# Patient Record
Sex: Female | Born: 2000
Health system: Southern US, Community
[De-identification: ages and names within clinical notes are randomized; demographics above are authoritative.]

## PROBLEM LIST (undated history)

## (undated) DIAGNOSIS — H6690 Otitis media, unspecified, unspecified ear: Secondary | ICD-10-CM

## (undated) DIAGNOSIS — L709 Acne, unspecified: Secondary | ICD-10-CM

## (undated) DIAGNOSIS — R630 Anorexia: Secondary | ICD-10-CM

## (undated) DIAGNOSIS — R55 Syncope and collapse: Secondary | ICD-10-CM

## (undated) DIAGNOSIS — H3500 Unspecified background retinopathy: Secondary | ICD-10-CM

## (undated) HISTORY — PX: WISDOM TOOTH EXTRACTION: SHX21

## (undated) HISTORY — DX: Acne, unspecified: L70.9

## (undated) HISTORY — PX: TYMPANOSTOMY TUBE PLACEMENT: SHX32

## (undated) HISTORY — PX: ADENOIDECTOMY: SUR15

## (undated) HISTORY — DX: Otitis media, unspecified, unspecified ear: H66.90

## (undated) HISTORY — PX: EYE SURGERY: SHX253

## (undated) HISTORY — DX: Unspecified background retinopathy: H35.00

---
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
## 2020-01-17 ENCOUNTER — Ambulatory Visit: Payer: BC Managed Care – PPO | Attending: Internal Medicine

## 2020-01-17 ENCOUNTER — Other Ambulatory Visit: Payer: Self-pay

## 2020-01-17 DIAGNOSIS — Z23 Encounter for immunization: Secondary | ICD-10-CM

## 2020-01-17 NOTE — Progress Notes (Signed)
   Covid-19 Vaccination Clinic  Name:  Lacoya Dobosz    MRN: [id number] DOB: 2000/12/25  01/17/2020  Ms. Branum was observed post Covid-19 immunization for 15 minutes without incident. She was provided with Vaccine Information Sheet and instruction to access the V-Safe system.   Ms. Haines was instructed to call 911 with any severe reactions post vaccine: Marland Kitchen Difficulty breathing  . Swelling of face and throat  . A fast heartbeat  . A bad rash all over body  . Dizziness and weakness   Immunizations Administered    Name Date Dose VIS Date Route   Pfizer COVID-19 Vaccine 01/17/2020 11:26 AM 0.3 mL 10/18/2019 Intramuscular   Manufacturer: [place]   Lot: [id number]   [place]: [id number]

## 2020-02-12 ENCOUNTER — Other Ambulatory Visit: Payer: Self-pay

## 2020-02-12 ENCOUNTER — Ambulatory Visit: Payer: BC Managed Care – PPO | Attending: Internal Medicine

## 2020-02-12 ENCOUNTER — Ambulatory Visit: Payer: BC Managed Care – PPO | Admitting: Dermatology

## 2020-02-12 DIAGNOSIS — L905 Scar conditions and fibrosis of skin: Secondary | ICD-10-CM | POA: Diagnosis not present

## 2020-02-12 DIAGNOSIS — L700 Acne vulgaris: Secondary | ICD-10-CM | POA: Diagnosis not present

## 2020-02-12 DIAGNOSIS — Z1283 Encounter for screening for malignant neoplasm of skin: Secondary | ICD-10-CM | POA: Diagnosis not present

## 2020-02-12 DIAGNOSIS — Z23 Encounter for immunization: Secondary | ICD-10-CM

## 2020-02-12 DIAGNOSIS — D229 Melanocytic nevi, unspecified: Secondary | ICD-10-CM

## 2020-02-12 DIAGNOSIS — Z808 Family history of malignant neoplasm of other organs or systems: Secondary | ICD-10-CM | POA: Diagnosis not present

## 2020-02-12 DIAGNOSIS — L578 Other skin changes due to chronic exposure to nonionizing radiation: Secondary | ICD-10-CM

## 2020-02-12 NOTE — Progress Notes (Signed)
   Covid-19 Vaccination Clinic  Name:  Breanna Cook    MRN: [id number] DOB: 01-05-2001  02/12/2020  Breanna Cook was observed post Covid-19 immunization for 15 minutes without incident. She was provided with Vaccine Information Sheet and instruction to access the V-Safe system.   Breanna Cook was instructed to call 911 with any severe reactions post vaccine: Marland Kitchen Difficulty breathing  . Swelling of face and throat  . A fast heartbeat  . A bad rash all over body  . Dizziness and weakness   Immunizations Administered    Name Date Dose VIS Date Route   Pfizer COVID-19 Vaccine 02/12/2020  2:20 PM 0.3 mL 10/18/2019 Intramuscular   Manufacturer: [place]   Lot: [id number]   [place]: [id number]

## 2020-02-12 NOTE — Patient Instructions (Addendum)
Recommend taking Heliocare sun protection supplement daily in sunny weather for additional sun protection. For maximum protection on the sunniest days, you can take up to 2 capsules of regular Heliocare OR take 1 capsule of Heliocare Ultra. For prolonged exposure (such as a full day in the sun), you can repeat your dose of the supplement 4 hours after your first dose. Heliocare can be purchased at [facility] or at [email or web address].   Recommend daily broad spectrum sunscreen SPF 30+ to sun-exposed areas, reapply every 2 hours as needed. Call for new or changing lesions.  Doxycycline should be taken with food to prevent nausea. Do not lay down for 30 minutes after taking. Be cautious with sun exposure and use good sun protection while on this medication. Pregnant women should not take this medication.   Spironolactone can cause increased urination and cause blood pressure to decrease. Please watch for signs of lightheadedness and be cautious when changing position. It can sometimes cause breast tenderness or an irregular period in premenopausal women. It can also increase potassium. The increase in potassium usually is not a concern unless you are taking other medicines that also increase potassium, so please be sure your doctor knows all of the other medications you are taking. This medication should not be taken  by pregnant women.

## 2020-02-12 NOTE — Progress Notes (Signed)
   Follow-Up Visit   Subjective  Breanna Cook is a 19 y.o. female who presents for the following: Annual Exam (FhxMM) and Acne. Patient taking spironolactone 100mg  1 po qhs, doxycycline 20mg  qd for acne. Also using clindamycin solution and CLn wash. Patient advises she is being more consistent with acne regimen and has seen some improvement.  She is still breaking out and break outs lasts a few days.   The following portions of the chart were reviewed this encounter and updated as appropriate: Tobacco  Allergies  Meds  Problems  Med Hx  Surg Hx  Fam Hx      Review of Systems: No other skin or systemic complaints.  Objective  Well appearing patient in no apparent distress; mood and affect are within normal limits.  A full examination was performed including scalp, head, eyes, ears, nose, lips, neck, chest, axillae, abdomen, back, buttocks, bilateral upper extremities, bilateral lower extremities, hands, feet, fingers, toes, fingernails, and toenails. All findings within normal limits unless otherwise noted below.  Objective  face, back: Trace open comedones at face Rare tiny inflammatory papule at back  Objective  back: Acne scars  Assessment & Plan    Skin cancer screening performed today.  Melanocytic Nevi - Tan-brown and/or pink-flesh-colored symmetric macules and papules - Benign appearing on exam today - Observation - Call clinic for new or changing moles - Recommend daily use of broad spectrum spf 30+ sunscreen to sun-exposed areas.   Actinic Damage, Chronic - diffuse scaly erythematous macules with underlying dyspigmentation - Recommend daily broad spectrum sunscreen SPF 30+ to sun-exposed areas, reapply every 2 hours as needed.  - Call for new or changing lesions.  Acne vulgaris face, back  Chronic. Well controlled currently.  Cont spironolactone 100mg  1 PO QHS. Cont doxycycline 20mg  1PO QD-BID with food.  Cont Cln wash Cont clindamycin solution QD BP  122/76  Spironolactone can cause increased urination and cause blood pressure to decrease. Please watch for signs of lightheadedness and be cautious when changing position. It can sometimes cause breast tenderness or an irregular period in premenopausal women. It can also increase potassium. The increase in potassium usually is not a concern unless you are taking other medicines that also increase potassium, so please be sure your doctor knows all of the other medications you are taking. This medication should not be taken  by pregnant women.  Doxycycline should be taken with food to prevent nausea. Do not lay down for 30 minutes after taking. Be cautious with sun exposure and use good sun protection while on this medication. Pregnant women should not take this medication.   Recommend taking Heliocare sun protection supplement daily in sunny weather for additional sun protection. For maximum protection on the sunniest days, you can take up to 2 capsules of regular Heliocare OR take 1 capsule of Heliocare Ultra. For prolonged exposure (such as a full day in the sun), you can repeat your dose of the supplement 4 hours after your first dose. Heliocare can be purchased at [facility] or at [email or web address].    Scar back  Discussed microneedling to help improve scars  Return in about 6 months (around 08/13/2020) for ACNE.   Graciella Belton, RMA, am acting as scribe for Forest Gleason, MD .   Documentation: I have reviewed the above documentation for accuracy and completeness, and I agree with the above.  Forest Gleason, MD

## 2020-03-13 ENCOUNTER — Encounter: Payer: Self-pay | Admitting: Dermatology

## 2020-04-21 DIAGNOSIS — H5203 Hypermetropia, bilateral: Secondary | ICD-10-CM | POA: Diagnosis not present

## 2020-04-21 DIAGNOSIS — H52223 Regular astigmatism, bilateral: Secondary | ICD-10-CM | POA: Diagnosis not present

## 2020-04-21 DIAGNOSIS — H5043 Accommodative component in esotropia: Secondary | ICD-10-CM | POA: Diagnosis not present

## 2020-04-22 DIAGNOSIS — F339 Major depressive disorder, recurrent, unspecified: Secondary | ICD-10-CM | POA: Diagnosis not present

## 2020-04-22 DIAGNOSIS — N938 Other specified abnormal uterine and vaginal bleeding: Secondary | ICD-10-CM | POA: Diagnosis not present

## 2020-04-22 DIAGNOSIS — F419 Anxiety disorder, unspecified: Secondary | ICD-10-CM | POA: Diagnosis not present

## 2020-04-22 DIAGNOSIS — E559 Vitamin D deficiency, unspecified: Secondary | ICD-10-CM | POA: Diagnosis not present

## 2020-05-05 ENCOUNTER — Other Ambulatory Visit: Payer: Self-pay

## 2020-05-05 MED ORDER — SPIRONOLACTONE 100 MG PO TABS: 100.0000 mg | ORAL_TABLET | Freq: Every day | ORAL | 2 refills | Status: DC

## 2020-05-05 NOTE — Progress Notes (Signed)
Fax from [place] requesting Spironolactone refill approved.

## 2020-06-08 DIAGNOSIS — H5043 Accommodative component in esotropia: Secondary | ICD-10-CM | POA: Diagnosis not present

## 2020-06-08 DIAGNOSIS — H5203 Hypermetropia, bilateral: Secondary | ICD-10-CM | POA: Diagnosis not present

## 2020-06-11 DIAGNOSIS — N939 Abnormal uterine and vaginal bleeding, unspecified: Secondary | ICD-10-CM | POA: Diagnosis not present

## 2020-06-11 DIAGNOSIS — Z975 Presence of (intrauterine) contraceptive device: Secondary | ICD-10-CM | POA: Diagnosis not present

## 2020-06-11 DIAGNOSIS — N921 Excessive and frequent menstruation with irregular cycle: Secondary | ICD-10-CM | POA: Diagnosis not present

## 2020-06-23 DIAGNOSIS — Z20822 Contact with and (suspected) exposure to covid-19: Secondary | ICD-10-CM | POA: Diagnosis not present

## 2020-07-03 DIAGNOSIS — J0100 Acute maxillary sinusitis, unspecified: Secondary | ICD-10-CM | POA: Diagnosis not present

## 2020-07-09 DIAGNOSIS — N921 Excessive and frequent menstruation with irregular cycle: Secondary | ICD-10-CM | POA: Diagnosis not present

## 2020-07-09 DIAGNOSIS — Z975 Presence of (intrauterine) contraceptive device: Secondary | ICD-10-CM | POA: Diagnosis not present

## 2020-07-09 DIAGNOSIS — N939 Abnormal uterine and vaginal bleeding, unspecified: Secondary | ICD-10-CM | POA: Diagnosis not present

## 2020-07-16 DIAGNOSIS — Z23 Encounter for immunization: Secondary | ICD-10-CM | POA: Diagnosis not present

## 2020-08-07 ENCOUNTER — Other Ambulatory Visit: Payer: Self-pay | Admitting: Dermatology

## 2020-08-10 ENCOUNTER — Other Ambulatory Visit: Payer: Self-pay | Admitting: Dermatology

## 2020-08-20 ENCOUNTER — Encounter: Payer: Self-pay | Admitting: Dermatology

## 2020-08-20 ENCOUNTER — Other Ambulatory Visit: Payer: Self-pay

## 2020-08-20 ENCOUNTER — Other Ambulatory Visit: Payer: Self-pay | Admitting: Dermatology

## 2020-08-20 ENCOUNTER — Ambulatory Visit: Payer: BC Managed Care – PPO | Admitting: Dermatology

## 2020-08-20 VITALS — BP 123/82 | HR 106

## 2020-08-20 DIAGNOSIS — L700 Acne vulgaris: Secondary | ICD-10-CM

## 2020-08-20 MED ORDER — CLINDAMYCIN PHOSPHATE 1 % EX SOLN: CUTANEOUS | 5 refills | Status: DC

## 2020-08-20 MED ORDER — DOXYCYCLINE HYCLATE 20 MG PO TABS: 20.0000 mg | ORAL_TABLET | Freq: Two times a day (BID) | ORAL | 5 refills | Status: AC

## 2020-08-20 MED ORDER — SPIRONOLACTONE 100 MG PO TABS
100.0000 mg | ORAL_TABLET | Freq: Every day | ORAL | 2 refills | Status: DC
Start: 2020-08-20 — End: 2021-03-16

## 2020-08-20 NOTE — Patient Instructions (Signed)
Recommend daily broad spectrum sunscreen SPF 30+ to sun-exposed areas, reapply every 2 hours as needed. Call for new or changing lesions.  

## 2020-08-20 NOTE — Progress Notes (Signed)
   Follow-Up Visit   Subjective  Breanna Cook is a 19 y.o. female who presents for the following: Follow-up (Acne).  Patient presents today for follow up on OV 02/12/20 for Acne. Patient is using Clindamycin solution, doxycycline 20 mg QD, Spironolactone 100mg  1po QD. She reports she is doing very well.   The following portions of the chart were reviewed this encounter and updated as appropriate:  Tobacco  Allergies  Meds  Problems  Med Hx  Surg Hx  Fam Hx      Review of Systems:  No other skin or systemic complaints except as noted in HPI or Assessment and Plan.  Objective  Well appearing patient in no apparent distress; mood and affect are within normal limits.  A focused examination was performed including face, neck, chest and back. Relevant physical exam findings are noted in the Assessment and Plan.  Objective  Face, Chest and Back: Face clear, Rare open comedone at chest Back clear   Assessment & Plan  Acne vulgaris Face, Chest and Back  Chronic, well controlled  Stop Doxycycline Continue Spironolactone 100 mg QHS Continue Clindamycin solution  Spironolactone can cause increased urination and cause blood pressure to decrease. Please watch for signs of lightheadedness and be cautious when changing position. It can sometimes cause breast tenderness or an irregular period in premenopausal women. It can also increase potassium. The increase in potassium usually is not a concern unless you are taking other medicines that also increase potassium, so please be sure your doctor knows all of the other medications you are taking. This medication should not be taken by pregnant women.  This medicine should also not be taken together with sulfa drugs like Bactrim (trimethoprim/sulfamethexazole).    Return in about 6 months (around 02/18/2021) for Acne.  IDonzetta Kohut, CMA, am acting as scribe for Forest Gleason, MD .  Documentation: I have reviewed the above documentation  for accuracy and completeness, and I agree with the above.  Forest Gleason, MD

## 2020-09-01 ENCOUNTER — Encounter: Payer: Self-pay | Admitting: Dermatology

## 2020-09-14 ENCOUNTER — Ambulatory Visit (INDEPENDENT_AMBULATORY_CARE_PROVIDER_SITE_OTHER): Payer: BC Managed Care – PPO | Admitting: Obstetrics & Gynecology

## 2020-09-14 ENCOUNTER — Other Ambulatory Visit: Payer: Self-pay

## 2020-09-14 ENCOUNTER — Encounter: Payer: Self-pay | Admitting: Obstetrics & Gynecology

## 2020-09-14 VITALS — BP 138/93 | HR 102 | Ht 60.75 in | Wt 125.0 lb

## 2020-09-14 DIAGNOSIS — Z01818 Encounter for other preprocedural examination: Secondary | ICD-10-CM

## 2020-09-14 DIAGNOSIS — Z30430 Encounter for insertion of intrauterine contraceptive device: Secondary | ICD-10-CM

## 2020-09-14 LAB — POCT URINE PREGNANCY: Preg Test, Ur: NEGATIVE

## 2020-09-14 MED ORDER — LEVONORGESTREL 19.5 MG IU IUD: INTRAUTERINE_SYSTEM | Freq: Once | INTRAUTERINE | Status: AC

## 2020-09-14 NOTE — Progress Notes (Signed)
    GYNECOLOGY OFFICE PROCEDURE NOTE  Breanna Cook is a 19 y.o. G0P0000 here for [facility] IUD insertion. No GYN concerns.  Had a lot of questions which were addressed prior to placement of the IUD. Accompanied by her boyfriend.   IUD Insertion Procedure Note Patient identified, informed consent performed, consent signed.   Discussed risks of irregular bleeding, cramping, infection, malpositioning or misplacement of the IUD outside the uterus which may require further procedure such as laparoscopy. Also discussed >99% contraception efficacy, increased risk of ectopic pregnancy with failure of method.   Emphasized that this did not protect against STIs, condoms recommended during all sexual encounters. Time out was performed.  Urine pregnancy test negative.  Speculum placed in the vagina.  Cervix visualized.  Cleaned with Betadine x 2.  Grasped anteriorly with a single tooth tenaculum.  Uterus sounded to 7 cm.  Kyleena IUD placed per manufacturer's recommendations.  Strings trimmed to 3 cm. Tenaculum was removed, good hemostasis noted.  Patient tolerated procedure well.   Patient was given post-procedure instructions.  She was advised to have backup contraception for one week.  Patient was also asked to follow up in 4 weeks for IUD check.   Verita Schneiders, MD, [place] for [employer], [place]

## 2020-09-14 NOTE — Patient Instructions (Signed)
IUD PLACEMENT POST-PROCEDURE INSTRUCTIONS  1. You may take Ibuprofen, Aleve or Tylenol for pain if needed for cramping  2. You may have a small amount of spotting.  You should wear a mini pad for the next few days.  3. You may have intercourse after 24 hours.  If you using this for birth control, it is effective immediately.  4. You need to call if you have any pelvic pain, fever, heavy bleeding or foul smelling vaginal discharge.  Irregular bleeding is common the first several months after having an IUD placed. You do not need to call for this reason unless you are concerned.  5. Shower or bathe as normal  6. You should have a follow-up appointment in 4 weeks for a re-check to make sure you are not having any problems.  7. Please take your birth control pills for the next month, this will help with the irregular bleeding

## 2020-09-29 DIAGNOSIS — N921 Excessive and frequent menstruation with irregular cycle: Secondary | ICD-10-CM | POA: Diagnosis not present

## 2020-09-29 DIAGNOSIS — Z113 Encounter for screening for infections with a predominantly sexual mode of transmission: Secondary | ICD-10-CM | POA: Diagnosis not present

## 2020-09-29 DIAGNOSIS — Z975 Presence of (intrauterine) contraceptive device: Secondary | ICD-10-CM | POA: Diagnosis not present

## 2020-09-29 DIAGNOSIS — T8332XA Displacement of intrauterine contraceptive device, initial encounter: Secondary | ICD-10-CM | POA: Diagnosis not present

## 2020-09-29 DIAGNOSIS — E559 Vitamin D deficiency, unspecified: Secondary | ICD-10-CM | POA: Diagnosis not present

## 2020-09-29 DIAGNOSIS — Z23 Encounter for immunization: Secondary | ICD-10-CM | POA: Diagnosis not present

## 2020-09-29 DIAGNOSIS — N939 Abnormal uterine and vaginal bleeding, unspecified: Secondary | ICD-10-CM | POA: Diagnosis not present

## 2020-09-29 DIAGNOSIS — Z0000 Encounter for general adult medical examination without abnormal findings: Secondary | ICD-10-CM | POA: Diagnosis not present

## 2020-10-02 DIAGNOSIS — T8332XA Displacement of intrauterine contraceptive device, initial encounter: Secondary | ICD-10-CM | POA: Diagnosis not present

## 2020-10-02 DIAGNOSIS — Z975 Presence of (intrauterine) contraceptive device: Secondary | ICD-10-CM | POA: Diagnosis not present

## 2020-10-02 DIAGNOSIS — N938 Other specified abnormal uterine and vaginal bleeding: Secondary | ICD-10-CM | POA: Diagnosis not present

## 2020-10-02 DIAGNOSIS — N939 Abnormal uterine and vaginal bleeding, unspecified: Secondary | ICD-10-CM | POA: Diagnosis not present

## 2020-10-02 DIAGNOSIS — N921 Excessive and frequent menstruation with irregular cycle: Secondary | ICD-10-CM | POA: Diagnosis not present

## 2020-10-12 ENCOUNTER — Other Ambulatory Visit: Payer: Self-pay

## 2020-10-12 ENCOUNTER — Ambulatory Visit (INDEPENDENT_AMBULATORY_CARE_PROVIDER_SITE_OTHER): Payer: BC Managed Care – PPO | Admitting: Obstetrics & Gynecology

## 2020-10-12 ENCOUNTER — Encounter: Payer: Self-pay | Admitting: Obstetrics & Gynecology

## 2020-10-12 VITALS — BP 124/79 | HR 89 | Wt 126.4 lb

## 2020-10-12 DIAGNOSIS — Z30431 Encounter for routine checking of intrauterine contraceptive device: Secondary | ICD-10-CM

## 2020-10-12 NOTE — Progress Notes (Signed)
    GYNECOLOGY OFFICE ENCOUNTER NOTE  History:  19 y.o. G0P0000 here today for today for IUDstring check; Kyleena  IUD was placed 09/14/2020. No complaints about the IUD, no concerning side effects.  The following portions of the patient's history were reviewed and updated as appropriate: allergies, current medications, past family history, past medical history, past social history, past surgical history and problem list.   Review of Systems:  Pertinent items are noted in HPI.   Objective:  Physical Exam Blood pressure 124/79, pulse 89, weight 126 lb 6.4 oz (57.3 kg), last menstrual period 10/05/2020. CONSTITUTIONAL: Well-developed, well-nourished female in no acute distress.  HENT:  Normocephalic, atraumatic. External right and left ear normal. Oropharynx is clear and moist EYES: Conjunctivae and EOM are normal. Pupils are equal, round, and reactive to light. No scleral icterus.  NECK: Normal range of motion, supple, no masses CARDIOVASCULAR: Normal heart rate noted RESPIRATORY: Effort and breath sounds normal, no problems with respiration noted ABDOMEN: Soft, no distention noted.   PELVIC: Normal appearing external genitalia; normal appearing vaginal mucosa and cervix.  Kyleena bright blue IUD strings visualized, about 2 cm in length outside cervix.   Assessment & Plan:  Patient to keep IUD in place for up to five years; can come in for removal if she desires pregnancy earlier or for any concerning side effects.  I spent 10 minutes dedicated to the care of this patient including pre-visit review of records, face to face time with the patient discussing her conditions and treatments and post visit ordering of testing.  Verita Schneiders, MD, [place] for [employer], [place]

## 2020-10-12 NOTE — Patient Instructions (Signed)
Return to clinic for any scheduled appointments or for any gynecologic concerns as needed.   

## 2020-10-28 DIAGNOSIS — N6452 Nipple discharge: Secondary | ICD-10-CM | POA: Diagnosis not present

## 2020-10-28 DIAGNOSIS — F419 Anxiety disorder, unspecified: Secondary | ICD-10-CM | POA: Diagnosis not present

## 2020-10-28 DIAGNOSIS — Z0000 Encounter for general adult medical examination without abnormal findings: Secondary | ICD-10-CM | POA: Diagnosis not present

## 2020-10-28 DIAGNOSIS — F32A Depression, unspecified: Secondary | ICD-10-CM | POA: Diagnosis not present

## 2020-10-29 DIAGNOSIS — Z1389 Encounter for screening for other disorder: Secondary | ICD-10-CM | POA: Diagnosis not present

## 2020-10-29 DIAGNOSIS — E559 Vitamin D deficiency, unspecified: Secondary | ICD-10-CM | POA: Diagnosis not present

## 2020-10-29 DIAGNOSIS — Z0000 Encounter for general adult medical examination without abnormal findings: Secondary | ICD-10-CM | POA: Diagnosis not present

## 2020-10-29 DIAGNOSIS — Z1329 Encounter for screening for other suspected endocrine disorder: Secondary | ICD-10-CM | POA: Diagnosis not present

## 2020-10-29 DIAGNOSIS — N6452 Nipple discharge: Secondary | ICD-10-CM | POA: Diagnosis not present

## 2020-10-29 DIAGNOSIS — E538 Deficiency of other specified B group vitamins: Secondary | ICD-10-CM | POA: Diagnosis not present

## 2020-11-09 DIAGNOSIS — N643 Galactorrhea not associated with childbirth: Secondary | ICD-10-CM | POA: Diagnosis not present

## 2020-11-09 DIAGNOSIS — N938 Other specified abnormal uterine and vaginal bleeding: Secondary | ICD-10-CM | POA: Diagnosis not present

## 2020-11-12 DIAGNOSIS — E538 Deficiency of other specified B group vitamins: Secondary | ICD-10-CM | POA: Diagnosis not present

## 2020-11-12 DIAGNOSIS — N643 Galactorrhea not associated with childbirth: Secondary | ICD-10-CM | POA: Diagnosis not present

## 2020-11-12 DIAGNOSIS — R319 Hematuria, unspecified: Secondary | ICD-10-CM | POA: Diagnosis not present

## 2020-11-16 ENCOUNTER — Encounter: Payer: Self-pay | Admitting: Internal Medicine

## 2020-11-19 NOTE — Addendum Note (Signed)
Addended by: Orland Mustard on: 11/19/2020 04:40 PM   Modules accepted: Orders

## 2020-11-20 ENCOUNTER — Other Ambulatory Visit: Payer: Self-pay | Admitting: General Surgery

## 2020-11-20 DIAGNOSIS — L0591 Pilonidal cyst without abscess: Secondary | ICD-10-CM | POA: Diagnosis not present

## 2020-11-20 NOTE — Addendum Note (Signed)
Addended by: Orland Mustard on: 11/20/2020 08:14 AM   Modules accepted: Orders

## 2020-11-20 NOTE — Addendum Note (Signed)
Addended by: Thressa Sheller on: 11/20/2020 07:52 AM   Modules accepted: Orders

## 2020-11-25 DIAGNOSIS — L0591 Pilonidal cyst without abscess: Secondary | ICD-10-CM | POA: Diagnosis not present

## 2021-02-20 ENCOUNTER — Ambulatory Visit
Admission: EM | Admit: 2021-02-20 | Discharge: 2021-02-20 | Disposition: A | Discharge status: Home / Self Care | Payer: BC Managed Care – PPO

## 2021-02-20 DIAGNOSIS — Z79899 Other long term (current) drug therapy: Secondary | ICD-10-CM | POA: Diagnosis not present

## 2021-02-20 DIAGNOSIS — Z30432 Encounter for removal of intrauterine contraceptive device: Secondary | ICD-10-CM | POA: Diagnosis not present

## 2021-02-20 DIAGNOSIS — R102 Pelvic and perineal pain: Secondary | ICD-10-CM | POA: Diagnosis not present

## 2021-02-25 ENCOUNTER — Ambulatory Visit: Payer: BC Managed Care – PPO | Admitting: Dermatology

## 2021-02-25 ENCOUNTER — Other Ambulatory Visit: Payer: Self-pay

## 2021-02-25 DIAGNOSIS — L700 Acne vulgaris: Secondary | ICD-10-CM | POA: Diagnosis not present

## 2021-02-25 MED ORDER — CLINDAMYCIN PHOSPHATE 1 % EX SOLN
Freq: Every day | CUTANEOUS | 11 refills | Status: DC
Start: 2021-02-25 — End: 2021-03-16
  Filled 2021-02-25: qty 60, 30d supply, fill #0

## 2021-02-25 MED ORDER — SPIRONOLACTONE 100 MG PO TABS
100.0000 mg | ORAL_TABLET | Freq: Two times a day (BID) | ORAL | 11 refills | Status: DC
Start: 2021-02-25 — End: 2021-03-16
  Filled 2021-02-25: qty 60, 30d supply, fill #0

## 2021-02-25 MED ORDER — DOXYCYCLINE HYCLATE 20 MG PO TABS
20.0000 mg | ORAL_TABLET | Freq: Two times a day (BID) | ORAL | 11 refills | Status: AC
  Filled 2021-02-25: qty 60, 30d supply, fill #0

## 2021-02-25 NOTE — Patient Instructions (Addendum)
Continue clindamycin solution daily. Continue Cln acne wash. Continue spironolactone 100mg  1 by mouth at bedtime. May restart doxycycline 20mg  1-2 times daily with food as needed for flares.   Spironolactone can cause increased urination and cause blood pressure to decrease. Please watch for signs of lightheadedness and be cautious when changing position. It can sometimes cause breast tenderness or an irregular period in premenopausal women. It can also increase potassium. The increase in potassium usually is not a concern unless you are taking other medicines that also increase potassium, so please be sure your doctor knows all of the other medications you are taking. This medication should not be taken by pregnant women.  This medicine should also not be taken together with sulfa drugs like Bactrim (trimethoprim/sulfamethexazole).   Doxycycline should be taken with food to prevent nausea. Do not lay down for 30 minutes after taking. Be cautious with sun exposure and use good sun protection while on this medication. Pregnant women should not take this medication.   Recommend taking Heliocare sun protection supplement daily in sunny weather for additional sun protection. For maximum protection on the sunniest days, you can take up to 2 capsules of regular Heliocare OR take 1 capsule of Heliocare Ultra. For prolonged exposure (such as a full day in the sun), you can repeat your dose of the supplement 4 hours after your first dose. Heliocare can be purchased at [facility] or at [email or web address].   Recommend daily broad spectrum sunscreen SPF 30+ to sun-exposed areas, reapply every 2 hours as needed. Call for new or changing lesions.  Staying in the shade or wearing long sleeves, sun glasses (UVA+UVB protection) and wide brim hats (4-inch brim around the entire circumference of the hat) are also recommended for sun protection.

## 2021-02-25 NOTE — Progress Notes (Signed)
   Follow-Up Visit   Subjective  Breanna Cook is a 20 y.o. female who presents for the following: Follow-up (Patient here today for 6 month acne follow up. She is taking spironolactone 100mg  QHS, using clindamycin solution daily and restarted doxycycline 20mg  once daily about 2 weeks ago because of acne flare. ).  Patient advises acne has been worse but is not clear. She has changed birth control methods recently.   The following portions of the chart were reviewed this encounter and updated as appropriate:   Tobacco  Allergies  Meds  Problems  Med Hx  Surg Hx  Fam Hx      Review of Systems:  No other skin or systemic complaints except as noted in HPI or Assessment and Plan.  Objective  Well appearing patient in no apparent distress; mood and affect are within normal limits.  A focused examination was performed including face. Relevant physical exam findings are noted in the Assessment and Plan.  Objective  Face: Trace open comedones, rare small resolving inflammatory papule at face Back with rare resolving inflammatory papule, few small scars   Assessment & Plan  Acne vulgaris Face  Chronic condition with duration or expected duration over one year. Condition is bothersome to patient. Not currently at goal.  BP 122/76  Continue clindamycin solution QAM Continue Cln acne wash daily. Continue spironolactone 100mg  1 PO QHS Restart doxycycline 20 mg twice a day Discussed adding tretinoin 0.025% cream QHS as tolerated. Pt defers at this time.   Doxycycline should be taken with food to prevent nausea. Do not lay down for 30 minutes after taking. Be cautious with sun exposure and use good sun protection while on this medication. Pregnant women should not take this medication.   Spironolactone can cause increased urination and cause blood pressure to decrease. Please watch for signs of lightheadedness and be cautious when changing position. It can sometimes cause breast  tenderness or an irregular period in premenopausal women. It can also increase potassium. The increase in potassium usually is not a concern unless you are taking other medicines that also increase potassium, so please be sure your doctor knows all of the other medications you are taking. This medication should not be taken by pregnant women.  This medicine should also not be taken together with sulfa drugs like Bactrim (trimethoprim/sulfamethexazole).   Topical retinoid medications like tretinoin/Retin-A, adapalene/Differin, tazarotene/Fabior, and Epiduo/Epiduo Forte can cause dryness and irritation when first started. Only apply a pea-sized amount to the entire affected area. Avoid applying it around the eyes, edges of mouth and creases at the nose. If you experience irritation, use a good moisturizer first and/or apply the medicine less often. If you are doing well with the medicine, you can increase how often you use it until you are applying every night. Be careful with sun protection while using this medication as it can make you sensitive to the sun. This medicine should not be used by pregnant women.    Ordered Medications: spironolactone (ALDACTONE) 100 MG tablet doxycycline (PERIOSTAT) 20 MG tablet clindamycin (CLEOCIN T) 1 % external solution  Return today (on 02/25/2021) for Acne.  Graciella Belton, RMA, am acting as scribe for Forest Gleason, MD .  Documentation: I have reviewed the above documentation for accuracy and completeness, and I agree with the above.  Forest Gleason, MD

## 2021-03-01 ENCOUNTER — Encounter: Payer: Self-pay | Admitting: Dermatology

## 2021-03-04 ENCOUNTER — Other Ambulatory Visit: Payer: Self-pay | Admitting: Dermatology

## 2021-03-05 ENCOUNTER — Other Ambulatory Visit: Payer: Self-pay

## 2021-03-05 NOTE — Telephone Encounter (Signed)
Left message on voicemail to return my call. The prescription for Spironolactone that was sent in on 02/25/21 was incorrect. It should've been sent in for QD #30 [id number]. Did patient fill this already?

## 2021-03-11 ENCOUNTER — Other Ambulatory Visit: Payer: Self-pay

## 2021-03-13 ENCOUNTER — Other Ambulatory Visit: Payer: Self-pay | Admitting: Dermatology

## 2021-03-15 ENCOUNTER — Other Ambulatory Visit: Payer: Self-pay

## 2021-03-16 ENCOUNTER — Ambulatory Visit (INDEPENDENT_AMBULATORY_CARE_PROVIDER_SITE_OTHER): Payer: BC Managed Care – PPO | Admitting: Obstetrics & Gynecology

## 2021-03-16 ENCOUNTER — Other Ambulatory Visit: Payer: Self-pay

## 2021-03-16 ENCOUNTER — Encounter: Payer: Self-pay | Admitting: Obstetrics & Gynecology

## 2021-03-16 VITALS — BP 132/86 | HR 96 | Wt 130.4 lb

## 2021-03-16 DIAGNOSIS — T839XXD Unspecified complication of genitourinary prosthetic device, implant and graft, subsequent encounter: Secondary | ICD-10-CM | POA: Diagnosis not present

## 2021-03-16 DIAGNOSIS — Z3009 Encounter for other general counseling and advice on contraception: Secondary | ICD-10-CM | POA: Diagnosis not present

## 2021-03-16 NOTE — Progress Notes (Signed)
GYNECOLOGY OFFICE VISIT NOTE  History:   Breanna Cook is a 20 y.o. G0P0000 here today with report that her Mirena IUD fell out on 02/20/21 when removing menstrual cup. This was placed in 2021.  She was doing well with it but reported episodes of cramping.  Wants to discuss what to do next.  Only sexually active with her boyfriend.  She has been on OCPs prescribed by outside provider since 02/2021. She denies any abnormal vaginal discharge, bleeding, pelvic pain or other concerns.    Past Medical History:  Diagnosis Date  . Acne   . Anorexia   . AOM (acute otitis media)    recurrent (Dr Janace Hoard)  . Preterm infant    31.5 weeks  . 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 Syncope   . Vasovagal syncope     Past Surgical History:  Procedure Laterality Date  . ADENOIDECTOMY     still with tonsils 2003   . EYE SURGERY     r eye in 2010   . TYMPANOSTOMY TUBE PLACEMENT     X 3  . WISDOM TOOTH EXTRACTION     2019    The following portions of the patient's history were reviewed and updated as appropriate: allergies, current medications, past family history, past medical history, past social history, past surgical history and problem list.   Health Maintenance:  Has received complete HPV vaccine series.  Review of Systems:  Pertinent items noted in HPI and remainder of comprehensive ROS otherwise negative.  Physical Exam:  BP 132/86   Pulse 96   Wt 130 lb 6.4 oz (59.1 kg)   BMI 24.84 kg/m  CONSTITUTIONAL: Well-developed, well-nourished female in no acute distress.  HEENT:  Normocephalic, atraumatic. External right and left ear normal. No scleral icterus.  NECK: Normal range of motion, supple, no masses noted on observation SKIN: No rash noted. Not diaphoretic. No erythema. No pallor. MUSCULOSKELETAL: Normal range of motion. No edema noted. NEUROLOGIC: Alert and oriented to person, place, and time. Normal muscle tone coordination. No cranial nerve  deficit noted. PSYCHIATRIC: Normal mood and affect. Normal behavior. Normal judgment and thought content. CARDIOVASCULAR: Normal heart rate noted RESPIRATORY: Effort and breath sounds normal, no problems with respiration noted ABDOMEN: No masses noted. No other overt distention noted.   PELVIC: Deferred     Assessment and Plan:      1. IUD expulsion 2. General counseling and advice on female contraception Offered replacement of Mirena, she declined for now.  Reviewed all forms of reversible birth control options available including abstinence; fertility period awareness methods; over the counter/barrier methods; hormonal contraceptive medication including pill, patch, ring, injection,contraceptive implant; hormonal and nonhormonal IUDs. Risks and benefits reviewed.  Questions were answered.  Information was given to patient to review.  She is leaning towards Nexplanon, she will research this and let us know if she wants this inserted. In the meantime, she will continue prescribed OCPs. She was told to let us know if she needs refills. Condoms recommended for STI prevention (of note, she was offered STI screening today, she declined).  Routine preventative health maintenance measures emphasized. Please refer to After Visit Summary for other counseling recommendations.   Return for Nexplanon insertion when patient desires.    I spent 15 minutes dedicated to the care of this patient including pre-visit review of records, face to face time with the patient discussing her conditions and contraceptive options.    Jacqueline Delapena  Harolyn Rutherford, MD, [place], [facility] for [employer], [place]

## 2021-03-16 NOTE — Patient Instructions (Signed)
Etonogestrel implant What is this medicine? ETONOGESTREL (et oh noe JES trel) is a contraceptive (birth control) device. It is used to prevent pregnancy. It can be used for up to 3 years. This medicine may be used for other purposes; ask your health care provider or pharmacist if you have questions. COMMON BRAND NAME(S): Implanon, Nexplanon What should I tell my health care provider before I take this medicine? They need to know if you have any of these conditions:  abnormal vaginal bleeding  blood vessel disease or blood clots  breast, cervical, endometrial, ovarian, liver, or uterine cancer  diabetes  gallbladder disease  heart disease or recent heart attack  high blood pressure  high cholesterol or triglycerides  kidney disease  liver disease  migraine headaches  seizures  stroke  tobacco smoker  an unusual or allergic reaction to etonogestrel, anesthetics or antiseptics, other medicines, foods, dyes, or preservatives  pregnant or trying to get pregnant  breast-feeding How should I use this medicine? This device is inserted just under the skin on the inner side of your upper arm by a health care professional. Talk to your pediatrician regarding the use of this medicine in children. Special care may be needed. Overdosage: If you think you have taken too much of this medicine contact a poison control center or emergency room at once. NOTE: This medicine is only for you. Do not share this medicine with others. What if I miss a dose? This does not apply. What may interact with this medicine? Do not take this medicine with any of the following medications:  amprenavir  fosamprenavir This medicine may also interact with the following medications:  acitretin  aprepitant  armodafinil  bexarotene  bosentan  carbamazepine  certain medicines for fungal infections like fluconazole, ketoconazole, itraconazole and voriconazole  certain medicines to treat  hepatitis, HIV or AIDS  cyclosporine  felbamate  griseofulvin  lamotrigine  modafinil  oxcarbazepine  phenobarbital  phenytoin  primidone  rifabutin  rifampin  rifapentine  St. John's wort  topiramate This list may not describe all possible interactions. Give your health care provider a list of all the medicines, herbs, non-prescription drugs, or dietary supplements you use. Also tell them if you smoke, drink alcohol, or use illegal drugs. Some items may interact with your medicine. What should I watch for while using this medicine? This product does not protect you against HIV infection (AIDS) or other sexually transmitted diseases. You should be able to feel the implant by pressing your fingertips over the skin where it was inserted. Contact your doctor if you cannot feel the implant, and use a non-hormonal birth control method (such as condoms) until your doctor confirms that the implant is in place. Contact your doctor if you think that the implant may have broken or become bent while in your arm. You will receive a user card from your health care provider after the implant is inserted. The card is a record of the location of the implant in your upper arm and when it should be removed. Keep this card with your health records. What side effects may I notice from receiving this medicine? Side effects that you should report to your doctor or health care professional as soon as possible:  allergic reactions like skin rash, itching or hives, swelling of the face, lips, or tongue  breast lumps, breast tissue changes, or discharge  breathing problems  changes in emotions or moods  coughing up blood  if you feel that the implant   may have broken or bent while in your arm  high blood pressure  pain, irritation, swelling, or bruising at the insertion site  scar at site of insertion  signs of infection at the insertion site such as fever, and skin redness, pain or  discharge  signs and symptoms of a blood clot such as breathing problems; changes in vision; chest pain; severe, sudden headache; pain, swelling, warmth in the leg; trouble speaking; sudden numbness or weakness of the face, arm or leg  signs and symptoms of liver injury like dark yellow or brown urine; general ill feeling or flu-like symptoms; light-colored stools; loss of appetite; nausea; right upper belly pain; unusually weak or tired; yellowing of the eyes or skin  unusual vaginal bleeding, discharge Side effects that usually do not require medical attention (report to your doctor or health care professional if they continue or are bothersome):  acne  breast pain or tenderness  headache  irregular menstrual bleeding  nausea This list may not describe all possible side effects. Call your doctor for medical advice about side effects. You may report side effects to FDA at 1-800-FDA-1088. Where should I keep my medicine? This drug is given in a hospital or clinic and will not be stored at home. NOTE: This sheet is a summary. It may not cover all possible information. If you have questions about this medicine, talk to your doctor, pharmacist, or health care provider.  2021 Elsevier/Gold Standard (2019-08-06 11:33:04)  

## 2021-03-30 ENCOUNTER — Ambulatory Visit: Payer: BC Managed Care – PPO | Admitting: Obstetrics & Gynecology

## 2021-03-30 DIAGNOSIS — K5900 Constipation, unspecified: Secondary | ICD-10-CM | POA: Diagnosis not present

## 2021-04-02 ENCOUNTER — Other Ambulatory Visit: Payer: Self-pay

## 2021-04-02 ENCOUNTER — Ambulatory Visit
Admission: EM | Admit: 2021-04-02 | Discharge: 2021-04-02 | Disposition: A | Discharge status: Home / Self Care | Payer: BC Managed Care – PPO | Attending: Physician Assistant | Admitting: Physician Assistant

## 2021-04-02 DIAGNOSIS — R0981 Nasal congestion: Secondary | ICD-10-CM | POA: Diagnosis not present

## 2021-04-02 DIAGNOSIS — Z20822 Contact with and (suspected) exposure to covid-19: Secondary | ICD-10-CM | POA: Insufficient documentation

## 2021-04-02 DIAGNOSIS — J309 Allergic rhinitis, unspecified: Secondary | ICD-10-CM | POA: Insufficient documentation

## 2021-04-02 MED ORDER — IPRATROPIUM BROMIDE 0.06 % NA SOLN: 2.0000 | Freq: Four times a day (QID) | NASAL | 0 refills | Status: DC

## 2021-04-02 NOTE — Discharge Instructions (Signed)
Symptoms most consistent with allergies or common cold.  I have sent a different nasal spray for you to try to the pharmacy.  Also I suggest that you start over-the-counter (behind the pharmacy counter) Sudafed or Claritin-D.  Increase rest and fluids.  Your symptoms should improve and resolve over the next 5 days.  If they do not or they worsen you should be seen again.  You have received COVID testing today either for positive exposure, concerning symptoms that could be related to COVID infection, screening purposes, or re-testing after confirmed positive.  Your test obtained today checks for active viral infection in the last 1-2 weeks. If your test is negative now, you can still test positive later. So, if you do develop symptoms you should either get re-tested and/or isolate x 5 days and then strict mask use x 5 days (unvaccinated) or mask use x 10 days (vaccinated). Please follow CDC guidelines.  While Rapid antigen tests come back in 15-20 minutes, send out PCR/molecular test results typically come back within 1-3 days. In the mean time, if you are symptomatic, assume this could be a positive test and treat/monitor yourself as if you do have COVID.   We will call with test results if positive. Please download the MyChart app and set up a profile to access test results.   If symptomatic, go home and rest. Push fluids. Take Tylenol as needed for discomfort. Gargle warm salt water. Throat lozenges. Take Mucinex DM or Robitussin for cough. Humidifier in bedroom to ease coughing. Warm showers. Also review the COVID handout for more information.  COVID-19 INFECTION: The incubation period of COVID-19 is approximately 14 days after exposure, with most symptoms developing in roughly 4-5 days. Symptoms may range in severity from mild to critically severe. Roughly 80% of those infected will have mild symptoms. People of any age may become infected with COVID-19 and have the ability to transmit the virus. The  most common symptoms include: fever, fatigue, cough, body aches, headaches, sore throat, nasal congestion, shortness of breath, nausea, vomiting, diarrhea, changes in smell and/or taste.    COURSE OF ILLNESS Some patients may begin with mild disease which can progress quickly into critical symptoms. If your symptoms are worsening please call ahead to the Emergency Department and proceed there for further treatment. Recovery time appears to be roughly 1-2 weeks for mild symptoms and 3-6 weeks for severe disease.   GO IMMEDIATELY TO ER FOR FEVER YOU ARE UNABLE TO GET DOWN WITH TYLENOL, BREATHING PROBLEMS, CHEST PAIN, FATIGUE, LETHARGY, INABILITY TO EAT OR DRINK, ETC  QUARANTINE AND ISOLATION: To help decrease the spread of COVID-19 please remain isolated if you have COVID infection or are highly suspected to have COVID infection. This means -stay home and isolate to one room in the home if you live with others. Do not share a bed or bathroom with others while ill, sanitize and wipe down all countertops and keep common areas clean and disinfected. Stay home for 5 days. If you have no symptoms or your symptoms are resolving after 5 days, you can leave your house. Continue to wear a mask around others for 5 additional days. If you have been in close contact (within 6 feet) of someone diagnosed with COVID 19, you are advised to quarantine in your home for 14 days as symptoms can develop anywhere from 2-14 days after exposure to the virus. If you develop symptoms, you  must isolate.  Most current guidelines for COVID after exposure -unvaccinated: isolate  5 days and strict mask use x 5 days. Test on day 5 is possible -vaccinated: wear mask x 10 days if symptoms do not develop -You do not necessarily need to be tested for COVID if you have + exposure and  develop symptoms. Just isolate at home x10 days from symptom onset During this global pandemic, CDC advises to practice social distancing, try to stay at least  55ft away from others at all times. Wear a face covering. Wash and sanitize your hands regularly and avoid going anywhere that is not necessary.  KEEP IN MIND THAT THE COVID TEST IS NOT 100% ACCURATE AND YOU SHOULD STILL DO EVERYTHING TO PREVENT POTENTIAL SPREAD OF VIRUS TO OTHERS (WEAR MASK, WEAR GLOVES, [place] HANDS AND SANITIZE REGULARLY). IF INITIAL TEST IS NEGATIVE, THIS MAY NOT MEAN YOU ARE DEFINITELY NEGATIVE. MOST ACCURATE TESTING IS DONE 5-7 DAYS AFTER EXPOSURE.   It is not advised by CDC to get re-tested after receiving a positive COVID test since you can still test positive for weeks to months after you have already cleared the virus.   *If you have not been vaccinated for COVID, I strongly suggest you consider getting vaccinated as long as there are no contraindications.

## 2021-04-02 NOTE — ED Provider Notes (Signed)
[facility]    CSN: [id number] Arrival date & time: 04/02/21  1629      History   Chief Complaint Chief Complaint  Patient presents with  . Nasal Congestion    HPI Syniyah Cook is a 20 y.o. female presenting with 4-day history of nasal congestion.  Patient denies any other symptoms.  She denies any fever, fatigue, cough, sore throat, ear pain or sinus pain.  No chest pain or breathing difficulty.  No sick contacts and no known exposure to COVID-19.  Vaccinated for COVID-19 x3.  She says she has been using over-the-counter Flonase and Afrin and they both temporarily help.  Patient concerned because she says she has never had a cold last this long and she believes she may have a sinus infection.  No other complaints today.  HPI  Past Medical History:  Diagnosis Date  . Acne   . Anorexia   . AOM (acute otitis media)    recurrent (Dr Janace Hoard)  . Preterm infant    31.5 weeks  . 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 Syncope   . Vasovagal syncope     Patient Active Problem List   Diagnosis Date Noted  . Oral contraceptive use 09/24/2019  . Dyspareunia in female 09/24/2019  . Annual physical exam 08/20/2019  . Fatigue 02/23/2011  . ACUTE PHARYNGITIS 01/13/2011  . DYSURIA 01/10/2011  . CERVICAL LYMPHADENOPATHY, LEFT 11/22/2010  . VIRAL URI 08/23/2010  . WRIST PAIN, RIGHT 06/14/2010  . CROUP 12/18/2009  . ABDOMINAL PAIN 09/12/2009  . ABDOMINAL PAIN, EPIGASTRIC 02/26/2009  . DERMATITIS, ALLERGIC 03/03/2008  . NAUSEA WITH VOMITING 02/22/2008  . LEG PAIN, BILATERAL 01/28/2008  . SLEEPLESSNESS 01/28/2008    Past Surgical History:  Procedure Laterality Date  . ADENOIDECTOMY     still with tonsils 2003   . EYE SURGERY     r eye in 2010   . TYMPANOSTOMY TUBE PLACEMENT     X 3  . WISDOM TOOTH EXTRACTION     2019    OB History    Gravida  0   Para  0   Term  0   Preterm  0   AB  0   Living  0     SAB  0   IAB   0   Ectopic  0   Multiple  0   Live Births  0            Home Medications    Prior to Admission medications   Medication Sig Start Date End Date Taking? Authorizing Provider  clindamycin (CLEOCIN T) 1 % external solution Apply  a small amount to skin twice a day 08/20/20  Yes Moye, [place], MD  ipratropium (ATROVENT) 0.06 % nasal spray Place 2 sprays into both nostrils 4 (four) times daily for 7 days. 04/02/21 04/09/21 Yes Danton Clap, PA-C  Multiple Vitamin (MULTIVITAMIN ADULT PO) Take by mouth.   Yes [provider]  norgestimate-ethinyl estradiol (MONO-LINYAH) 0.25-35 MG-MCG tablet Take 1 tablet by mouth daily. 10/02/19  Yes Woodroe Mode, MD  spironolactone (ALDACTONE) 100 MG tablet TAKE 1 TABLET BY MOUTH DAILY. 08/10/20 08/10/21 Yes Moye, [place], MD  amoxicillin-clavulanate (AUGMENTIN) 875-125 MG tablet TAKE 1 TABLET BY MOUTH EVERY 12 HOURS FOR 7 DAYS Patient taking differently: Take 1 tablet by mouth every 12 (twelve) hours. for 7 days 11/20/20 11/20/21  Robert Bellow, MD  Vitamin D, Ergocalciferol, (DRISDOL) 1.25 MG (50000  UT) CAPS capsule  08/28/19   [provider]    Family History Family History  Problem Relation Age of Onset  . Diabetes Mother        Type 1  . Other Mother        AMI- in her 47's  . Heart attack Mother        in her 62s   . Heart attack Paternal Grandmother   . Breast cancer Other        dads grandmother ?    Social History Social History   Tobacco Use  . Smoking status: Never Smoker  . Smokeless tobacco: Never Used  Substance Use Topics  . Alcohol use: Never  . Drug use: Never     Allergies   Patient has no known allergies.   Review of Systems Review of Systems  Constitutional: Negative for chills, diaphoresis, fatigue and fever.  HENT: Positive for congestion and rhinorrhea. Negative for ear pain, sinus pressure, sinus pain and sore throat.   Respiratory: Negative for cough and shortness of breath.    Gastrointestinal: Negative for abdominal pain, nausea and vomiting.  Musculoskeletal: Negative for arthralgias and myalgias.  Skin: Negative for rash.  Neurological: Negative for weakness and headaches.  Hematological: Negative for adenopathy.     Physical Exam Triage Vital Signs ED Triage Vitals  Enc Vitals Group     BP 04/02/21 1645 109/77     Pulse Rate 04/02/21 1645 94     Resp 04/02/21 1645 18     Temp 04/02/21 1645 99.2 F (37.3 C)     Temp Source 04/02/21 1645 Oral     SpO2 04/02/21 1645 100 %     Weight --      Height --      Head Circumference --      Peak Flow --      Pain Score 04/02/21 1641 0     Pain Loc --      Pain Edu? --      Excl. in [place]? --    No data found.  Updated Vital Signs BP 109/77 (BP Location: Left Arm)   Pulse 94   Temp 99.2 F (37.3 C) (Oral)   Resp 18   LMP 03/19/2021   SpO2 100%       Physical Exam Vitals and nursing note reviewed.  Constitutional:      General: She is not in acute distress.    Appearance: Normal appearance. She is not ill-appearing or toxic-appearing.  HENT:     Head: Normocephalic and atraumatic.     Right Ear: Tympanic membrane, ear canal and external ear normal.     Left Ear: Tympanic membrane, ear canal and external ear normal.     Nose: Congestion present.     Mouth/Throat:     Mouth: Mucous membranes are moist.     Pharynx: Oropharynx is clear. Posterior oropharyngeal erythema (mild with cobblestone appearance of posterior pharynx) present.  Eyes:     General: No scleral icterus.       Right eye: No discharge.        Left eye: No discharge.     Conjunctiva/sclera: Conjunctivae normal.  Cardiovascular:     Rate and Rhythm: Normal rate and regular rhythm.     Heart sounds: Normal heart sounds.  Pulmonary:     Effort: Pulmonary effort is normal. No respiratory distress.     Breath sounds: Normal breath sounds.  Musculoskeletal:     Cervical back:  Neck supple.  Skin:    General: Skin is dry.   Neurological:     General: No focal deficit present.     Mental Status: She is alert. Mental status is at baseline.     Motor: No weakness.     Gait: Gait normal.  Psychiatric:        Mood and Affect: Mood normal.        Behavior: Behavior normal.        Thought Content: Thought content normal.      UC Treatments / Results  Labs (all labs ordered are listed, but only abnormal results are displayed) Labs Reviewed  SARS CORONAVIRUS 2 (TAT 6-24 HRS)    EKG   Radiology No results found.  Procedures Procedures (including critical care time)  Medications Ordered in UC Medications - No data to display  Initial Impression / Assessment and Plan / UC Course  I have reviewed the triage vital signs and the nursing notes.  Pertinent labs & imaging results that were available during my care of the patient were reviewed by me and considered in my medical decision making (see chart for details).   20 year old female presenting for 4-day history of nasal congestion.  Vital signs are normal and stable.  Clinical presentation is most consistent with allergic rhinitis.  Treating her at this time with Atrovent nasal spray and advised her to purchase over-the-counter Sudafed her Claritin-D.  Encouraged her to increase rest and fluids.  COVID test obtained.  Current CDC guidelines, isolation protocol and ED precautions reviewed with patient.  Advised she should improve over the next week and if she does not or symptoms worsen she should be seen again.   Final Clinical Impressions(s) / UC Diagnoses   Final diagnoses:  Allergic rhinitis, unspecified seasonality, unspecified trigger  Nasal congestion     Discharge Instructions     Symptoms most consistent with allergies or common cold.  I have sent a different nasal spray for you to try to the pharmacy.  Also I suggest that you start over-the-counter (behind the pharmacy counter) Sudafed or Claritin-D.  Increase rest and fluids.  Your  symptoms should improve and resolve over the next 5 days.  If they do not or they worsen you should be seen again.  You have received COVID testing today either for positive exposure, concerning symptoms that could be related to COVID infection, screening purposes, or re-testing after confirmed positive.  Your test obtained today checks for active viral infection in the last 1-2 weeks. If your test is negative now, you can still test positive later. So, if you do develop symptoms you should either get re-tested and/or isolate x 5 days and then strict mask use x 5 days (unvaccinated) or mask use x 10 days (vaccinated). Please follow CDC guidelines.  While Rapid antigen tests come back in 15-20 minutes, send out PCR/molecular test results typically come back within 1-3 days. In the mean time, if you are symptomatic, assume this could be a positive test and treat/monitor yourself as if you do have COVID.   We will call with test results if positive. Please download the MyChart app and set up a profile to access test results.   If symptomatic, go home and rest. Push fluids. Take Tylenol as needed for discomfort. Gargle warm salt water. Throat lozenges. Take Mucinex DM or Robitussin for cough. Humidifier in bedroom to ease coughing. Warm showers. Also review the COVID handout for more information.  COVID-19 INFECTION: The incubation  period of COVID-19 is approximately 14 days after exposure, with most symptoms developing in roughly 4-5 days. Symptoms may range in severity from mild to critically severe. Roughly 80% of those infected will have mild symptoms. People of any age may become infected with COVID-19 and have the ability to transmit the virus. The most common symptoms include: fever, fatigue, cough, body aches, headaches, sore throat, nasal congestion, shortness of breath, nausea, vomiting, diarrhea, changes in smell and/or taste.    COURSE OF ILLNESS Some patients may begin with mild disease which  can progress quickly into critical symptoms. If your symptoms are worsening please call ahead to the Emergency Department and proceed there for further treatment. Recovery time appears to be roughly 1-2 weeks for mild symptoms and 3-6 weeks for severe disease.   GO IMMEDIATELY TO ER FOR FEVER YOU ARE UNABLE TO GET DOWN WITH TYLENOL, BREATHING PROBLEMS, CHEST PAIN, FATIGUE, LETHARGY, INABILITY TO EAT OR DRINK, ETC  QUARANTINE AND ISOLATION: To help decrease the spread of COVID-19 please remain isolated if you have COVID infection or are highly suspected to have COVID infection. This means -stay home and isolate to one room in the home if you live with others. Do not share a bed or bathroom with others while ill, sanitize and wipe down all countertops and keep common areas clean and disinfected. Stay home for 5 days. If you have no symptoms or your symptoms are resolving after 5 days, you can leave your house. Continue to wear a mask around others for 5 additional days. If you have been in close contact (within 6 feet) of someone diagnosed with COVID 19, you are advised to quarantine in your home for 14 days as symptoms can develop anywhere from 2-14 days after exposure to the virus. If you develop symptoms, you  must isolate.  Most current guidelines for COVID after exposure -unvaccinated: isolate 5 days and strict mask use x 5 days. Test on day 5 is possible -vaccinated: wear mask x 10 days if symptoms do not develop -You do not necessarily need to be tested for COVID if you have + exposure and  develop symptoms. Just isolate at home x10 days from symptom onset During this global pandemic, CDC advises to practice social distancing, try to stay at least 22ft away from others at all times. Wear a face covering. Wash and sanitize your hands regularly and avoid going anywhere that is not necessary.  KEEP IN MIND THAT THE COVID TEST IS NOT 100% ACCURATE AND YOU SHOULD STILL DO EVERYTHING TO PREVENT POTENTIAL  SPREAD OF VIRUS TO OTHERS (WEAR MASK, WEAR GLOVES, [place] HANDS AND SANITIZE REGULARLY). IF INITIAL TEST IS NEGATIVE, THIS MAY NOT MEAN YOU ARE DEFINITELY NEGATIVE. MOST ACCURATE TESTING IS DONE 5-7 DAYS AFTER EXPOSURE.   It is not advised by CDC to get re-tested after receiving a positive COVID test since you can still test positive for weeks to months after you have already cleared the virus.   *If you have not been vaccinated for COVID, I strongly suggest you consider getting vaccinated as long as there are no contraindications.      ED Prescriptions    Medication Sig Dispense Auth. Provider   ipratropium (ATROVENT) 0.06 % nasal spray Place 2 sprays into both nostrils 4 (four) times daily for 7 days. 15 mL Danton Clap, PA-C     PDMP not reviewed this encounter.   Danton Clap, PA-C 04/02/21 1733

## 2021-04-02 NOTE — ED Triage Notes (Signed)
Pt reports nasal congestion x 4 days.  Denies fever, cough, HA, vomiting, any other symptoms.  Has tried Afrin, Flonase, steamed showers, etc.  At home COVID test two days ago was negative.  Pt would like testing here.

## 2021-04-03 LAB — SARS CORONAVIRUS 2 (TAT 6-24 HRS): SARS Coronavirus 2: NEGATIVE

## 2021-04-06 DIAGNOSIS — K5900 Constipation, unspecified: Secondary | ICD-10-CM | POA: Diagnosis not present

## 2021-04-12 DIAGNOSIS — L03011 Cellulitis of right finger: Secondary | ICD-10-CM | POA: Diagnosis not present

## 2021-04-13 ENCOUNTER — Other Ambulatory Visit: Payer: Self-pay

## 2021-04-13 ENCOUNTER — Other Ambulatory Visit: Payer: Self-pay | Admitting: *Deleted

## 2021-04-13 DIAGNOSIS — T384X5D Adverse effect of oral contraceptives, subsequent encounter: Secondary | ICD-10-CM

## 2021-04-13 MED ORDER — NORGESTIMATE-ETH ESTRADIOL 0.25-35 MG-MCG PO TABS
1.0000 | ORAL_TABLET | Freq: Every day | ORAL | 2 refills | Status: DC
  Filled 2021-04-13: qty 28, 28d supply, fill #0

## 2021-04-20 ENCOUNTER — Encounter: Payer: Self-pay | Admitting: Obstetrics & Gynecology

## 2021-04-20 ENCOUNTER — Other Ambulatory Visit: Payer: Self-pay

## 2021-04-20 ENCOUNTER — Ambulatory Visit (INDEPENDENT_AMBULATORY_CARE_PROVIDER_SITE_OTHER): Payer: BC Managed Care – PPO | Admitting: Obstetrics & Gynecology

## 2021-04-20 VITALS — BP 122/83 | HR 89 | Wt 135.0 lb

## 2021-04-20 DIAGNOSIS — Z01812 Encounter for preprocedural laboratory examination: Secondary | ICD-10-CM

## 2021-04-20 DIAGNOSIS — Z30017 Encounter for initial prescription of implantable subdermal contraceptive: Secondary | ICD-10-CM

## 2021-04-20 LAB — POCT URINE PREGNANCY: Preg Test, Ur: NEGATIVE

## 2021-04-20 MED ORDER — ETONOGESTREL 68 MG ~~LOC~~ IMPL
68.0000 mg | DRUG_IMPLANT | Freq: Once | SUBCUTANEOUS | Status: AC
  Administered 2021-04-20: 68 mg via SUBCUTANEOUS

## 2021-04-20 NOTE — Progress Notes (Signed)
     GYNECOLOGY OFFICE PROCEDURE NOTE  Breanna Cook is a 20 y.o. G0P0000 here for Nexplanon insertion for contraception. Currently on OCPs.  No other gynecologic concerns.  Nexplanon Insertion Procedure Patient identified, informed consent performed, consent signed.   Patient does understand that irregular bleeding is a very common side effect of this medication. She was advised to have backup contraception for one week after placement. Pregnancy test in clinic today was negative.  Appropriate time out taken.  Patient's left arm was prepped and draped in the usual sterile fashion. The ruler used to measure and mark insertion area.  Patient was prepped with alcohol swab and then injected with 3 ml of 1% lidocaine.  She was prepped with betadine, Nexplanon removed from packaging,  Device confirmed in needle, then inserted full length of needle and withdrawn per handbook instructions. Nexplanon was able to palpated in the patient's arm; patient palpated the insert herself. There was minimal blood loss.  Patient insertion site covered with guaze and a pressure bandage to reduce any bruising.  The patient tolerated the procedure well and was given post procedure instructions.    Verita Schneiders, MD, [place] for [employer], [place]

## 2021-04-20 NOTE — Patient Instructions (Signed)
Nexplanon Instructions After Insertion  Keep bandage clean and dry for 24 hours  May use ice/Tylenol/Ibuprofen for soreness or pain  If you develop fever, drainage or increased warmth from incision site-contact office immediately   

## 2021-04-25 ENCOUNTER — Other Ambulatory Visit: Payer: Self-pay | Admitting: Dermatology

## 2021-04-26 ENCOUNTER — Other Ambulatory Visit: Payer: Self-pay

## 2021-04-26 MED ORDER — SPIRONOLACTONE 100 MG PO TABS
ORAL_TABLET | Freq: Every day | ORAL | 2 refills | Status: DC
  Filled 2021-04-26: qty 30, 30d supply, fill #0

## 2021-06-09 DIAGNOSIS — Z20822 Contact with and (suspected) exposure to covid-19: Secondary | ICD-10-CM | POA: Diagnosis not present

## 2021-10-03 ENCOUNTER — Ambulatory Visit
Admission: EM | Admit: 2021-10-03 | Discharge: 2021-10-03 | Disposition: A | Discharge status: Home / Self Care | Payer: BC Managed Care – PPO | Attending: Emergency Medicine | Admitting: Emergency Medicine

## 2021-10-03 DIAGNOSIS — J069 Acute upper respiratory infection, unspecified: Secondary | ICD-10-CM | POA: Diagnosis not present

## 2021-10-03 MED ORDER — AZITHROMYCIN 250 MG PO TABS: 250.0000 mg | ORAL_TABLET | Freq: Every day | ORAL | 0 refills | Status: DC

## 2021-10-03 NOTE — Discharge Instructions (Addendum)
Rest,push fluids, take meds as directed. Follow up with PCP.

## 2021-10-03 NOTE — ED Provider Notes (Signed)
[facility]    CSN: [id number] Arrival date & time: 10/03/21  8099      History   Chief Complaint Chief Complaint  Patient presents with   Cough   Nasal Congestion    HPI Breanna Cook is a 20 y.o. female.   20 year old female patient presents to urgent care chief complaint of cough nasal congestion x2 weeks.  Patient states she cannot shake the symptoms  The history is provided by the patient. No language interpreter was used.  Cough Cough characteristics:  Non-productive Sputum characteristics:  Nondescript Severity:  Moderate Onset quality:  Sudden Duration:  2 weeks Timing:  Intermittent Progression:  Waxing and waning Chronicity:  New Context: sick contacts   Relieved by:  Nothing Worsened by:  Nothing Ineffective treatments: Over-the-counter meds. 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 (Dr Annamaria Boots)   Syncope    Vasovagal syncope     Patient Active Problem List   Diagnosis Date Noted   Oral contraceptive use 09/24/2019   Dyspareunia in female 09/24/2019   Annual physical exam 08/20/2019   Fatigue 02/23/2011   ACUTE PHARYNGITIS 01/13/2011   DYSURIA 01/10/2011   CERVICAL LYMPHADENOPATHY, LEFT 11/22/2010   VIRAL URI 08/23/2010   WRIST PAIN, RIGHT 06/14/2010   CROUP 12/18/2009   ABDOMINAL PAIN 09/12/2009   ABDOMINAL PAIN, EPIGASTRIC 02/26/2009   DERMATITIS, ALLERGIC 03/03/2008   NAUSEA WITH VOMITING 02/22/2008   LEG PAIN, BILATERAL 01/28/2008   SLEEPLESSNESS 01/28/2008    Past Surgical History:  Procedure Laterality Date   ADENOIDECTOMY     still with tonsils 2003    EYE SURGERY     r eye in 2010    TYMPANOSTOMY TUBE PLACEMENT     X 3   WISDOM TOOTH EXTRACTION     2019    OB  History     Gravida  0   Para  0   Term  0   Preterm  0   AB  0   Living  0      SAB  0   IAB  0   Ectopic  0   Multiple  0   Live Births  0            Home Medications    Prior to Admission medications   Medication Sig Start Date End Date Taking? Authorizing Provider  azithromycin (ZITHROMAX) 250 MG tablet Take 1 tablet (250 mg total) by mouth daily. Take first 2 tablets together, then 1 every day until finished. 83/38/25  Yes Harry Bark, Jeanett Schlein, NP  etonogestrel (NEXPLANON) 68 MG IMPL implant 1 each by Subdermal route once.   Yes [provider]  amoxicillin-clavulanate (AUGMENTIN) 875-125 MG tablet TAKE 1 TABLET BY MOUTH EVERY 12 HOURS FOR 7 DAYS 11/20/20 11/20/21  Byrnett, Forest Gleason, MD  clindamycin (CLEOCIN T) 1 % external solution Apply  a small amount to skin twice a day 08/20/20   Moye, [place], MD  ipratropium (ATROVENT) 0.06 % nasal spray Place 2 sprays into both nostrils 4 (four) times daily for 7 days. 04/02/21 04/09/21  Laurene Footman B, PA-C  Multiple Vitamin (MULTIVITAMIN ADULT PO) Take by mouth.    [provider]  spironolactone (ALDACTONE) 100 MG tablet TAKE 1 TABLET BY MOUTH DAILY. 04/26/21 04/26/22  Moye, [place], MD  Vitamin D, Ergocalciferol, (DRISDOL) 1.25 MG (50000 UT) CAPS capsule  08/28/19   [provider]    Family History Family History  Problem Relation Age of Onset   Diabetes Mother        Type 1   Other Mother        AMI- in her 74's   Heart attack Mother        in her 36s    Heart attack Paternal Grandmother    Breast cancer Other        dads grandmother ?    Social History Social History   Tobacco Use   Smoking status: Never   Smokeless tobacco: Never  Substance Use Topics   Alcohol use: Never   Drug use: Never     Allergies   Patient has no known allergies.   Review of Systems Review of Systems  Constitutional:  Positive for fever.  HENT:  Positive for congestion, sinus pressure and  sinus pain.   Respiratory:  Positive for cough. Negative for wheezing.   All other systems reviewed and are negative.   Physical Exam Triage Vital Signs ED Triage Vitals  Enc Vitals Group     BP 10/03/21 0823 118/83     Pulse Rate 10/03/21 0823 94     Resp 10/03/21 0823 16     Temp 10/03/21 0823 99 F (37.2 C)     Temp Source 10/03/21 0823 Oral     SpO2 10/03/21 0823 100 %     Weight 10/03/21 0821 140 lb (63.5 kg)     Height 10/03/21 0821 5' (1.524 m)     Head Circumference --      Peak Flow --      Pain Score 10/03/21 0821 4     Pain Loc --      Pain Edu? --      Excl. in [place]? --    No data found.  Updated Vital Signs BP 118/83 (BP Location: Left Arm)   Pulse 94   Temp 99 F (37.2 C) (Oral)   Resp 16   Ht 5' (1.524 m)   Wt 140 lb (63.5 kg)   SpO2 100%   BMI 27.34 kg/m   Visual Acuity Right Eye Distance:   Left Eye Distance:   Bilateral Distance:    Right Eye Near:   Left Eye Near:    Bilateral Near:     Physical Exam Vitals and nursing note reviewed.  Constitutional:      General: She is not in acute distress.    Appearance: She is well-developed.  HENT:     Head: Normocephalic.     Right Ear: Tympanic membrane is retracted.     Left Ear: Tympanic membrane is retracted.     Nose: Congestion present.     Mouth/Throat:     Lips: Pink.     Mouth: Mucous membranes are moist.     Pharynx: Oropharynx is clear.  Eyes:     General: Lids are normal.     Conjunctiva/sclera: Conjunctivae normal.     Pupils: Pupils are equal, round, and reactive to light.  Neck:     Trachea: Trachea normal. No tracheal deviation.  Cardiovascular:     Rate and Rhythm: Normal rate and regular rhythm.     Pulses: Normal pulses.     Heart sounds: Normal heart sounds. No murmur heard.  Pulmonary:     Effort: Pulmonary effort is normal.     Breath sounds: Normal breath sounds and air entry.  Abdominal:     General: Bowel sounds are normal.     Palpations: Abdomen is soft.      Tenderness: There is no abdominal tenderness.  Musculoskeletal:        General: Normal range of motion.     Cervical back: Normal range of motion.  Lymphadenopathy:     Cervical: No cervical adenopathy.  Skin:    General: Skin is warm and dry.     Findings: No rash.  Neurological:     General: No focal deficit present.     Mental Status: She is alert and oriented to person, place, and time.     GCS: GCS eye subscore is 4. GCS verbal subscore is 5. GCS motor subscore is 6.  Psychiatric:        Attention and Perception: Attention normal.        Mood and Affect: Mood normal.        Speech: Speech normal.        Behavior: Behavior normal. Behavior is cooperative.     UC Treatments / Results  Labs (all labs ordered are listed, but only abnormal results are displayed) Labs Reviewed - No data to display  EKG   Radiology No results found.  Procedures Procedures (including critical care time)  Medications Ordered in UC Medications - No data to display  Initial Impression / Assessment and Plan / UC Course  I have reviewed the triage vital signs and the nursing notes.  Pertinent labs & imaging results that were available during my care of the patient were reviewed by me and considered in my medical decision making (see chart for details).     Ddx: URI, Viral illness Final Clinical Impressions(s) / UC Diagnoses   Final diagnoses:  Viral upper respiratory tract infection     Discharge Instructions      Rest,push fluids, take meds as directed. Follow up with PCP.      ED Prescriptions     Medication Sig Dispense Auth. Provider   azithromycin (ZITHROMAX) 250 MG tablet Take 1 tablet (250 mg total) by mouth daily. Take first 2 tablets together, then 1 every day until finished. 6 tablet Sahej Hauswirth, Jeanett Schlein, NP      PDMP not reviewed this encounter.   Tori Milks, NP 36/46/80 [phone]

## 2021-10-03 NOTE — ED Triage Notes (Signed)
Pt c/o of cough, congestion, sore throat sxs started 2 weeks ago.

## 2021-11-03 DIAGNOSIS — E538 Deficiency of other specified B group vitamins: Secondary | ICD-10-CM | POA: Diagnosis not present

## 2021-11-03 DIAGNOSIS — Z13220 Encounter for screening for lipoid disorders: Secondary | ICD-10-CM | POA: Diagnosis not present

## 2021-11-03 DIAGNOSIS — Z1329 Encounter for screening for other suspected endocrine disorder: Secondary | ICD-10-CM | POA: Diagnosis not present

## 2021-11-03 DIAGNOSIS — N3000 Acute cystitis without hematuria: Secondary | ICD-10-CM | POA: Diagnosis not present

## 2021-11-03 DIAGNOSIS — Z0000 Encounter for general adult medical examination without abnormal findings: Secondary | ICD-10-CM | POA: Diagnosis not present

## 2021-11-03 DIAGNOSIS — Z111 Encounter for screening for respiratory tuberculosis: Secondary | ICD-10-CM | POA: Diagnosis not present

## 2021-11-03 DIAGNOSIS — Z23 Encounter for immunization: Secondary | ICD-10-CM | POA: Diagnosis not present

## 2021-11-03 DIAGNOSIS — E559 Vitamin D deficiency, unspecified: Secondary | ICD-10-CM | POA: Diagnosis not present

## 2021-11-03 DIAGNOSIS — Z0184 Encounter for antibody response examination: Secondary | ICD-10-CM | POA: Diagnosis not present

## 2022-02-25 DIAGNOSIS — B3731 Acute candidiasis of vulva and vagina: Secondary | ICD-10-CM | POA: Diagnosis not present

## 2022-02-25 DIAGNOSIS — R590 Localized enlarged lymph nodes: Secondary | ICD-10-CM | POA: Diagnosis not present

## 2022-02-25 DIAGNOSIS — R110 Nausea: Secondary | ICD-10-CM | POA: Diagnosis not present

## 2022-02-25 DIAGNOSIS — J029 Acute pharyngitis, unspecified: Secondary | ICD-10-CM | POA: Diagnosis not present

## 2022-03-03 ENCOUNTER — Ambulatory Visit: Payer: BC Managed Care – PPO | Admitting: Dermatology

## 2022-03-03 DIAGNOSIS — R591 Generalized enlarged lymph nodes: Secondary | ICD-10-CM | POA: Diagnosis not present

## 2022-03-03 DIAGNOSIS — R161 Splenomegaly, not elsewhere classified: Secondary | ICD-10-CM | POA: Diagnosis not present

## 2022-03-03 DIAGNOSIS — G589 Mononeuropathy, unspecified: Secondary | ICD-10-CM | POA: Diagnosis not present

## 2022-04-11 ENCOUNTER — Ambulatory Visit
Admission: RE | Admit: 2022-04-11 | Discharge: 2022-04-11 | Disposition: A | Discharge status: Home / Self Care | Payer: BC Managed Care – PPO | Source: Ambulatory Visit | Attending: Emergency Medicine | Admitting: Emergency Medicine

## 2022-04-11 VITALS — BP 122/81 | HR 99 | Temp 99.3°F | Resp 16

## 2022-04-11 DIAGNOSIS — J069 Acute upper respiratory infection, unspecified: Secondary | ICD-10-CM | POA: Diagnosis not present

## 2022-04-11 LAB — POCT RAPID STREP A (OFFICE): Rapid Strep A Screen: NEGATIVE

## 2022-04-11 NOTE — ED Provider Notes (Signed)
Breanna Cook    CSN: [id number] Arrival date & time: 04/11/22  1647      History   Chief Complaint Chief Complaint  Patient presents with   Cough    Sore throat - started on Wednesday, 5/31. Some nasal congestion as well. Cold medicine & ibuprofen have helped momentarily but overall there hasn't been much of an improvement since Wednesday. - Entered by patient   Sore Throat   Nasal Congestion    HPI Breanna Cook is a 21 y.o. female.   Patient presents with nasal congestion, bilateral ear fullness, sore throat and a mild nonproductive cough for 6 days.  Known sick contacts.  Decreased appetite but has been tolerating food and liquids.  Has attempted use of DayQuil which has been helpful.  Denies headaches, shortness of breath, wheezing, abdominal pain, nausea, vomiting or diarrhea.  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 (Dr Annamaria Boots)   Syncope    Vasovagal syncope     Patient Active Problem List   Diagnosis Date Noted   Oral contraceptive use 09/24/2019   Dyspareunia in female 09/24/2019   Annual physical exam 08/20/2019   Fatigue 02/23/2011   ACUTE PHARYNGITIS 01/13/2011   DYSURIA 01/10/2011   CERVICAL LYMPHADENOPATHY, LEFT 11/22/2010   VIRAL URI 08/23/2010   WRIST PAIN, RIGHT 06/14/2010   CROUP 12/18/2009   ABDOMINAL PAIN 09/12/2009   ABDOMINAL PAIN, EPIGASTRIC 02/26/2009   DERMATITIS, ALLERGIC 03/03/2008   NAUSEA WITH VOMITING 02/22/2008   LEG PAIN, BILATERAL 01/28/2008   SLEEPLESSNESS 01/28/2008    Past Surgical History:  Procedure Laterality Date   ADENOIDECTOMY     still with tonsils 2003    EYE SURGERY     r eye in 2010    TYMPANOSTOMY TUBE PLACEMENT     X 3   WISDOM TOOTH EXTRACTION     2019    OB History     Gravida  0   Para  0   Term  0   Preterm  0   AB  0    Living  0      SAB  0   IAB  0   Ectopic  0   Multiple  0   Live Births  0            Home Medications    Prior to Admission medications   Medication Sig Start Date End Date Taking? Authorizing Provider  azithromycin (ZITHROMAX) 250 MG tablet Take 1 tablet (250 mg total) by mouth daily. Take first 2 tablets together, then 1 every day until finished. 92/11/94   Defelice, Jeanett Schlein, NP  clindamycin (CLEOCIN T) 1 % external solution Apply  a small amount to skin twice a day 08/20/20   Moye, [place], MD  etonogestrel (NEXPLANON) 68 MG IMPL implant 1 each by Subdermal route once.    [provider]  ipratropium (ATROVENT) 0.06 % nasal spray Place 2 sprays into both nostrils 4 (four) times daily for 7 days. 04/02/21 04/09/21  Laurene Footman B, PA-C  Multiple Vitamin (MULTIVITAMIN ADULT PO) Take by mouth.    [provider]  spironolactone (ALDACTONE) 100 MG tablet TAKE 1 TABLET BY MOUTH DAILY. 04/26/21 04/26/22  Moye, [place], MD  Vitamin D, Ergocalciferol, (DRISDOL) 1.25 MG (50000 UT)  CAPS capsule  08/28/19   [provider]    Family History Family History  Problem Relation Age of Onset   Diabetes Mother        Type 1   Other Mother        AMI- in her 41's   Heart attack Mother        in her 27s    Heart attack Paternal Grandmother    Breast cancer Other        dads grandmother ?    Social History Social History   Tobacco Use   Smoking status: Never   Smokeless tobacco: Never  Vaping Use   Vaping Use: Never used  Substance Use Topics   Alcohol use: Never   Drug use: Never     Allergies   Patient has no known allergies.   Review of Systems Review of Systems Defer to [facility]    Physical Exam Triage Vital Signs ED Triage Vitals  Enc Vitals Group     BP 04/11/22 1708 122/81     Pulse Rate 04/11/22 1706 99     Resp 04/11/22 1706 16     Temp 04/11/22 1708 99.3 F (37.4 C)     Temp Source 04/11/22 1708 Oral     SpO2 04/11/22 1706  97 %     Weight --      Height --      Head Circumference --      Peak Flow --      Pain Score 04/11/22 1706 0     Pain Loc --      Pain Edu? --      Excl. in [place]? --    No data found.  Updated Vital Signs BP 122/81 (BP Location: Left Arm)   Pulse 99   Temp 99.3 F (37.4 C) (Oral)   Resp 16   SpO2 97%   Visual Acuity Right Eye Distance:   Left Eye Distance:   Bilateral Distance:    Right Eye Near:   Left Eye Near:    Bilateral Near:     Physical Exam Constitutional:      Appearance: She is well-developed.  HENT:     Head: Normocephalic.     Right Ear: Tympanic membrane and ear canal normal.     Left Ear: Tympanic membrane and ear canal normal.     Nose: Congestion and rhinorrhea present.     Mouth/Throat:     Mouth: Mucous membranes are moist.     Pharynx: Posterior oropharyngeal erythema present.     Tonsils: No tonsillar exudate. 0 on the right. 0 on the left.  Cardiovascular:     Rate and Rhythm: Normal rate and regular rhythm.     Heart sounds: Normal heart sounds.  Pulmonary:     Effort: Pulmonary effort is normal.     Breath sounds: Normal breath sounds.  Musculoskeletal:     Cervical back: Normal range of motion and neck supple.  Skin:    General: Skin is warm and dry.  Neurological:     General: No focal deficit present.     Mental Status: She is alert and oriented to person, place, and time.  Psychiatric:        Mood and Affect: Mood normal.        Behavior: Behavior normal.     UC Treatments / Results  Labs (all labs ordered are listed, but only abnormal results are displayed) Labs Reviewed  POCT RAPID STREP A (  OFFICE)    EKG   Radiology No results found.  Procedures Procedures (including critical care time)  Medications Ordered in UC Medications - No data to display  Initial Impression / Assessment and Plan / UC Course  I have reviewed the triage vital signs and the nursing notes.  Pertinent labs & imaging results that were  available during my care of the patient were reviewed by me and considered in my medical decision making (see chart for details).  Viral URI  Vital signs are stable with a low-grade fever of 99.3 noted in triage, lungs are clear to auscultation and patient is in no signs of distress, low suspicion for pneumonia, pneumothorax or bronchitis, will defer chest x-ray, rapid strep negative, sent for culture, discussed findings with patient, etiology is most likely viral, recommended continued use of over-the-counter medications for supportive care as needed, may follow-up with this urgent care as needed Final Clinical Impressions(s) / UC Diagnoses   Final diagnoses:  Viral URI     Discharge Instructions      Your symptoms today are most likely being caused by a virus and should steadily improve in time it can take up to 7 to 10 days before you truly start to see a turnaround however things will get better  Strep test is negative, swab will be sent to the lab to determine if bacteria will grow, if this occurs you will be notified and antibiotics sent in at this time  At this time you do not have symptoms consistent with mono therefore we will hold off testing at this time    You can take Tylenol and/or Ibuprofen as needed for fever reduction and pain relief.   For cough: honey 1/2 to 1 teaspoon (you can dilute the honey in water or another fluid).  You can also use guaifenesin and dextromethorphan for cough. You can use a humidifier for chest congestion and cough.  If you don't have a humidifier, you can sit in the bathroom with the hot shower running.      For sore throat: try warm salt water gargles, cepacol lozenges, throat spray, warm tea or water with lemon/honey, popsicles or ice, or OTC cold relief medicine for throat discomfort.   For congestion: take a daily anti-histamine like Zyrtec, Claritin, and a oral decongestant, such as pseudoephedrine.  You can also use Flonase 1-2 sprays in  each nostril daily.   It is important to stay hydrated: drink plenty of fluids (water, gatorade/powerade/pedialyte, juices, or teas) to keep your throat moisturized and help further relieve irritation/discomfort.    ED Prescriptions   None    PDMP not reviewed this encounter.   Hans Eden, NP 04/11/22 1739

## 2022-04-11 NOTE — Discharge Instructions (Signed)
Your symptoms today are most likely being caused by a virus and should steadily improve in time it can take up to 7 to 10 days before you truly start to see a turnaround however things will get better  Strep test is negative, swab will be sent to the lab to determine if bacteria will grow, if this occurs you will be notified and antibiotics sent in at this time  At this time you do not have symptoms consistent with mono therefore we will hold off testing at this time    You can take Tylenol and/or Ibuprofen as needed for fever reduction and pain relief.   For cough: honey 1/2 to 1 teaspoon (you can dilute the honey in water or another fluid).  You can also use guaifenesin and dextromethorphan for cough. You can use a humidifier for chest congestion and cough.  If you don't have a humidifier, you can sit in the bathroom with the hot shower running.      For sore throat: try warm salt water gargles, cepacol lozenges, throat spray, warm tea or water with lemon/honey, popsicles or ice, or OTC cold relief medicine for throat discomfort.   For congestion: take a daily anti-histamine like Zyrtec, Claritin, and a oral decongestant, such as pseudoephedrine.  You can also use Flonase 1-2 sprays in each nostril daily.   It is important to stay hydrated: drink plenty of fluids (water, gatorade/powerade/pedialyte, juices, or teas) to keep your throat moisturized and help further relieve irritation/discomfort.

## 2022-04-11 NOTE — ED Triage Notes (Addendum)
Pt presents with cough, runny nose, ST x 4 days. Her sister had mono a few weeks ago.

## 2022-04-14 ENCOUNTER — Ambulatory Visit: Payer: BC Managed Care – PPO | Admitting: Internal Medicine

## 2022-04-14 LAB — CULTURE, GROUP A STREP (THRC)

## 2022-04-15 ENCOUNTER — Telehealth: Payer: Self-pay | Admitting: Internal Medicine

## 2022-04-15 ENCOUNTER — Other Ambulatory Visit: Payer: Self-pay

## 2022-04-15 ENCOUNTER — Telehealth (INDEPENDENT_AMBULATORY_CARE_PROVIDER_SITE_OTHER): Payer: BC Managed Care – PPO | Admitting: Internal Medicine

## 2022-04-15 ENCOUNTER — Encounter: Payer: Self-pay | Admitting: Internal Medicine

## 2022-04-15 DIAGNOSIS — J329 Chronic sinusitis, unspecified: Secondary | ICD-10-CM | POA: Diagnosis not present

## 2022-04-15 DIAGNOSIS — J029 Acute pharyngitis, unspecified: Secondary | ICD-10-CM

## 2022-04-15 LAB — MONONUCLEOSIS SCREEN: Mono Screen: NEGATIVE

## 2022-04-15 MED ORDER — AMOXICILLIN-POT CLAVULANATE 875-125 MG PO TABS: 1.0000 | ORAL_TABLET | Freq: Two times a day (BID) | ORAL | 0 refills | Status: DC

## 2022-04-15 MED ORDER — AMOXICILLIN-POT CLAVULANATE 875-125 MG PO TABS
1.0000 | ORAL_TABLET | Freq: Two times a day (BID) | ORAL | 0 refills | Status: DC
  Filled 2022-04-15: qty 20, 10d supply, fill #0

## 2022-04-15 NOTE — Patient Instructions (Addendum)
If needing prescription strength medication we will need to make an appointment with a provider.  These are over the counter medication options:  Mucinex dm green label for cough or robitussin DM  Multivitamin or below vitamins  Vitamin C 1000 mg daily.  Vitamin D3 4000 Iu (units) daily.  Zinc 100 mg daily.  Quercetin 250-500 mg 2 times per day   Elderberry  Oil of oregano  cepacol or chloroseptic spray for sore throat Warm salt water gargles +hydrogen peroxide Sugar free cough drops  Warm tea with honey and lemon  Hydration  Try to eat though you dont feel like it   Tylenol or Advil  Nasal saline and Flonase 2 sprays nasal congestion  If sneezing/runny nose over the counter allergy pill claritin,allegra, zyrtec, xyzal Quarantine x 10-14 days 14 days preferred   Monitor pulse oximeter, buy from [place] if oxygen is less than 90 please go to the hospital.            Sore Throat A sore throat is pain, burning, irritation, or scratchiness in the throat. When you have a sore throat, you may feel pain or tenderness in your throat when you swallow or talk. Many things can cause a sore throat, including: An infection. Seasonal allergies. Dryness in the air. Irritants, such as smoke or pollution. Radiation treatment for cancer. Gastroesophageal reflux disease (GERD). A tumor. A sore throat is often the first sign of another sickness. It may happen with other symptoms, such as coughing, sneezing, fever, and swollen neck glands. Most sore throats go away without medical treatment. Follow these instructions at home:     Medicines Take over-the-counter and prescription medicines only as told by your health care provider. Children often get sore throats. Do not give your child aspirin because of the association with Reye's syndrome. Use throat sprays to soothe your throat as told by your health care provider. Managing pain To help with pain, try: Sipping warm liquids, such as broth,  herbal tea, or warm water. Eating or drinking cold or frozen liquids, such as frozen ice pops. Gargling with a mixture of salt and water 3-4 times a day or as needed. To make salt water, completely dissolve -1 tsp (3-6 g) of salt in 1 cup (237 mL) of warm water. Sucking on hard candy or throat lozenges. Putting a cool-mist humidifier in your bedroom at night to moisten the air. Sitting in the bathroom with the door closed for 5-10 minutes while you run hot water in the shower. General instructions Do not use any products that contain nicotine or tobacco. These products include cigarettes, chewing tobacco, and vaping devices, such as e-cigarettes. If you need help quitting, ask your health care provider. Rest as needed. Drink enough fluid to keep your urine pale yellow. Wash your hands often with soap and water for at least 20 seconds. If soap and water are not available, use hand sanitizer. Contact a health care provider if: You have a fever for more than 2-3 days. You have symptoms that last for more than 2-3 days. Your throat does not get better within 7 days. You have a fever and your symptoms suddenly get worse. Get help right away if: You have difficulty breathing. You cannot swallow fluids, soft foods, or your saliva. You have increased swelling in your throat or neck. You have persistent nausea and vomiting. These symptoms may represent a serious problem that is an emergency. Do not wait to see if the symptoms will go away. Get  medical help right away. Call your local emergency services (911 in the [place] Do not drive yourself to the hospital. Summary A sore throat is pain, burning, irritation, or scratchiness in the throat. Many things can cause a sore throat. Take over-the-counter medicines only as told by your health care provider. Rest as needed. Drink enough fluid to keep your urine pale yellow. Contact a health care provider if your throat does not get better within 7  days. This information is not intended to replace advice given to you by your health care provider. Make sure you discuss any questions you have with your health care provider. Document Revised: 01/20/2021 Document Reviewed: 01/20/2021 Elsevier Patient Education  [employer].

## 2022-04-15 NOTE — Progress Notes (Signed)
Telephone Note  I connected with Tracie Harrier  on 04/15/22 at  9:00 AM EDT by a telephone application and verified that I am speaking with the correct person using two identifiers.  Location patient: [place] Location provider:work or home office Persons participating in the virtual visit: patient, provider  I discussed the limitations and requested verbal permission for telemedicine visit. The patient expressed understanding and agreed to proceed.   HPI:  Acute telemedicine visit for : Sore throat x 10 days seen 04/11/22 UC no meds rapid strep negative. Had sinus pain and congestion taking ibuprofen with relief tried cough med w/o help  No covid   -Pertinent past medical history: see below -Pertinent medication allergies:No Known Allergies -COVID-19 vaccine status:  Immunization History  Administered Date(s) Administered   HPV 9-valent 03/13/2017, 05/29/2017, 08/29/2017   Hepatitis A 07/26/2012   Influenza Whole 08/24/2009   Influenza,inj,Quad PF,6+ Mos 08/20/2019   Meningococcal Conjugate 07/26/2012, 10/12/2017, 01/12/2018   PFIZER(Purple Top)SARS-COV-2 Vaccination 01/17/2020, 02/12/2020   Tdap 10/03/2011     ROS: See pertinent positives and negatives per HPI.  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 X 7-10 days with food, Disp: 20  tablet, Rfl: 0   azithromycin (ZITHROMAX) 250 MG tablet, Take 1 tablet (250 mg total) by mouth daily. Take first 2 tablets together, then 1 every day until finished. (Patient not taking: Reported on 04/15/2022), Disp: 6 tablet, Rfl: 0   clindamycin (CLEOCIN T) 1 % external solution, Apply  a small amount to skin twice a day (Patient not taking: Reported on 04/15/2022), Disp: 30 mL, Rfl: 5   ipratropium (ATROVENT) 0.06 % nasal spray, Place 2 sprays into both nostrils 4 (four) times daily for 7 days. (Patient not taking: Reported on 04/15/2022), Disp: 15 mL, Rfl: 0   Multiple Vitamin (MULTIVITAMIN ADULT PO), Take by mouth. (Patient not taking: Reported on 04/15/2022), Disp: , Rfl:    spironolactone (ALDACTONE) 100 MG tablet, TAKE 1 TABLET BY MOUTH DAILY. (Patient not taking: Reported on 04/15/2022), Disp: 30 tablet, Rfl: 2   Vitamin D, Ergocalciferol, (DRISDOL) 1.25 MG (50000 UT) CAPS capsule, , Disp: , Rfl:   EXAM:  VITALS per patient if applicable:  GENERAL: alert, oriented, appears well and in no acute distress  HEENT: atraumatic, conjunttiva clear, no obvious abnormalities on inspection of external nose and ears    PSYCH/NEURO: pleasant and cooperative, no obvious depression or anxiety, speech and thought processing grossly intact  ASSESSMENT AND PLAN:  Discussed the following assessment and plan:  Sore throat - Plan: Culture, Group A Strep, Monospot, Novel Coronavirus, NAA ([facility]), amoxicillin-clavulanate (AUGMENTIN) 875-125 MG tablet, DISCONTINUED: amoxicillin-clavulanate (AUGMENTIN) 875-125  MG tablet  Sinusitis, unspecified chronicity, unspecified location - Plan: amoxicillin-clavulanate (AUGMENTIN) 875-125 MG tablet, DISCONTINUED: amoxicillin-clavulanate (AUGMENTIN) 875-125 MG tablet Supportive care   -we discussed possible serious and likely etiologies, options for evaluation and workup, limitations of telemedicine visit vs in person visit, treatment, treatment risks and precautions. Pt  is agreeable to treatment via telemedicine at this moment.   I discussed the assessment and treatment plan with the patient. The patient was provided an opportunity to ask questions and all were answered. The patient agreed with the plan and demonstrated an understanding of the instructions.    Time spent 20 minutes   Delorise Jackson, MD

## 2022-04-15 NOTE — Telephone Encounter (Signed)
Called Pt/No Answer/LVMTCB/ Please schedule 3 months follow up.Breanna KitchenMarland Cook

## 2022-04-16 LAB — NOVEL CORONAVIRUS, NAA: SARS-CoV-2, NAA: NOT DETECTED

## 2022-04-17 LAB — CULTURE, GROUP A STREP
MICRO NUMBER:: 13506520
SPECIMEN QUALITY:: ADEQUATE

## 2022-04-28 ENCOUNTER — Ambulatory Visit: Payer: BC Managed Care – PPO | Admitting: Dermatology

## 2022-05-04 ENCOUNTER — Ambulatory Visit: Payer: BC Managed Care – PPO | Admitting: Dermatology

## 2022-06-20 DIAGNOSIS — J069 Acute upper respiratory infection, unspecified: Secondary | ICD-10-CM | POA: Diagnosis not present

## 2022-06-20 DIAGNOSIS — J029 Acute pharyngitis, unspecified: Secondary | ICD-10-CM | POA: Diagnosis not present

## 2022-06-20 DIAGNOSIS — Z20822 Contact with and (suspected) exposure to covid-19: Secondary | ICD-10-CM | POA: Diagnosis not present

## 2022-06-30 ENCOUNTER — Encounter: Payer: Self-pay | Admitting: Nurse Practitioner

## 2022-07-14 ENCOUNTER — Encounter: Payer: Self-pay | Admitting: Nurse Practitioner

## 2022-07-14 ENCOUNTER — Ambulatory Visit (INDEPENDENT_AMBULATORY_CARE_PROVIDER_SITE_OTHER): Payer: BC Managed Care – PPO | Admitting: Nurse Practitioner

## 2022-07-14 VITALS — BP 122/80 | HR 96 | Temp 97.2°F | Resp 12 | Ht 60.0 in | Wt 135.4 lb

## 2022-07-14 DIAGNOSIS — Z8249 Family history of ischemic heart disease and other diseases of the circulatory system: Secondary | ICD-10-CM

## 2022-07-14 DIAGNOSIS — Z0000 Encounter for general adult medical examination without abnormal findings: Secondary | ICD-10-CM

## 2022-07-14 DIAGNOSIS — Z3041 Encounter for surveillance of contraceptive pills: Secondary | ICD-10-CM

## 2022-07-14 DIAGNOSIS — Z13220 Encounter for screening for lipoid disorders: Secondary | ICD-10-CM

## 2022-07-14 DIAGNOSIS — Z23 Encounter for immunization: Secondary | ICD-10-CM

## 2022-07-14 LAB — CBC
HCT: 38.3 % (ref 36.0–46.0)
Hemoglobin: 12.6 g/dL (ref 12.0–15.0)
MCHC: 32.9 g/dL (ref 30.0–36.0)
MCV: 79.7 fl (ref 78.0–100.0)
Platelets: 335 10*3/uL (ref 150.0–400.0)
RBC: 4.8 Mil/uL (ref 3.87–5.11)
RDW: 13.4 % (ref 11.5–15.5)
WBC: 6.2 10*3/uL (ref 4.0–10.5)

## 2022-07-14 LAB — COMPREHENSIVE METABOLIC PANEL
ALT: 12 U/L (ref 0–35)
AST: 17 U/L (ref 0–37)
Albumin: 4.2 g/dL (ref 3.5–5.2)
Alkaline Phosphatase: 61 U/L (ref 39–117)
BUN: 8 mg/dL (ref 6–23)
CO2: 26 mEq/L (ref 19–32)
Calcium: 9.4 mg/dL (ref 8.4–10.5)
Chloride: 105 mEq/L (ref 96–112)
Creatinine, Ser: 0.75 mg/dL (ref 0.40–1.20)
GFR: 114.14 mL/min (ref >60.00)
Glucose, Bld: 84 mg/dL (ref 70–99)
Potassium: 4 mEq/L (ref 3.5–5.1)
Sodium: 139 mEq/L (ref 135–145)
Total Bilirubin: 0.4 mg/dL (ref 0.2–1.2)
Total Protein: 7.1 g/dL (ref 6.0–8.3)

## 2022-07-14 LAB — LIPID PANEL
Cholesterol: 173 mg/dL (ref 0–200)
HDL: 59.4 mg/dL (ref >39.00)
LDL Cholesterol: 106 mg/dL — ABNORMAL HIGH (ref 0–99)
NonHDL: 114.01
Total CHOL/HDL Ratio: 3
Triglycerides: 38 mg/dL (ref 0.0–149.0)
VLDL: 7.6 mg/dL (ref 0.0–40.0)

## 2022-07-14 LAB — TSH: TSH: 1.01 u[IU]/mL (ref 0.35–5.50)

## 2022-07-14 NOTE — Patient Instructions (Signed)
Nice to see you today I will be in touch with the labs once I have the results Follow up with me in 1 year for your next physical, sooner if you need me Call and get your pap scheduled with your GYN

## 2022-07-14 NOTE — Progress Notes (Signed)
Established Patient Office Visit  Subjective   Patient ID: Breanna Cook, female    DOB: 2001/02/24  Age: 21 y.o. MRN: [id number]  Chief Complaint  Patient presents with   Transfer of Care    From [facility]   discuss diet/weight management    HPI for complete physical and follow up of chronic conditions.  Immunizations: -Tetanus: Update today -Influenza: Update today -Covid-19:pfzier x2 -Shingles: Too young -Pneumonia: Too young  -HPV: utd  Diet: Fair diet. States that she is tracking claories and eating approx 1300 to loss weight. Has been doing that for 14 weeks. States that she is eating more protein, more plant based and more complex carbs Exercise: No regular exercise. States that her employer has a running progeam  Eye exam: needs updating over a year. Dr. Annamaria Boots retired   Dental exam: Completes semi-annually   Pap Smear: Dr.A.  Has a nexplnaon and pap is due this year. 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 D: denies illicit drug use. S: no history of anxiety or depression. No hx of hospitalizations. No hx of self harm  S: practicing safe sex in a relationship with her boyfriend       Review of Systems  Constitutional:  Negative for chills, fever and malaise/fatigue.  Respiratory:  Negative for shortness of breath.   Cardiovascular:  Negative for chest pain and leg swelling.  Gastrointestinal:  Negative for abdominal pain, blood in stool, constipation, diarrhea, nausea and vomiting.       Bm daily  Genitourinary:  Negative for dysuria and hematuria.  Neurological:  Negative for headaches.   Psychiatric/Behavioral:  Negative for hallucinations and suicidal ideas.       Objective:     BP 122/80   Pulse 96   Temp (!) 97.2 F (36.2 C)   Resp 12   Ht 5' (1.524 m)   Wt 135 lb 6 oz (61.4 kg)   SpO2 97%   BMI 26.44 kg/m    Physical Exam Vitals and nursing note reviewed.  Constitutional:      Appearance: Normal appearance.  HENT:     Right Ear: Tympanic membrane, ear canal and external ear normal.     Left Ear: Tympanic membrane, ear canal and external ear normal.     Mouth/Throat:     Mouth: Mucous membranes are moist.     Pharynx: Oropharynx is clear.  Eyes:     Extraocular Movements: Extraocular movements intact.     Pupils: Pupils are equal, round, and reactive to light.  Cardiovascular:     Rate and Rhythm: Normal rate and regular rhythm.     Pulses: Normal pulses.     Heart sounds: Normal heart sounds.  Pulmonary:     Effort: Pulmonary effort is normal.     Breath sounds: Normal breath sounds.  Abdominal:     General: Bowel sounds are normal. There is no distension.     Palpations: There is no mass.     Tenderness: There is no abdominal tenderness.     Hernia: No hernia is present.  Musculoskeletal:     Right lower leg: No edema.     Left  lower leg: No edema.  Lymphadenopathy:     Cervical: No cervical adenopathy.  Skin:    General: Skin is warm.  Neurological:     General: No focal deficit present.     Mental Status: She is alert.     Deep Tendon Reflexes:     Reflex Scores:      Bicep reflexes are 2+ on the right side and 2+ on the left side.      Patellar reflexes are 2+ on the right side and 2+ on the left side.    Comments: Bilateral upper and lower extremity strength 5/5  Psychiatric:        Mood and Affect: Mood normal.        Behavior: Behavior normal.        Thought Content: Thought content normal.        Judgment: Judgment normal.      No results found for any visits on 07/14/22.    The ASCVD Risk score (Arnett DK, et al.,  2019) failed to calculate for the following reasons:   The 2019 ASCVD risk score is only valid for ages 60 to 10    Assessment & Plan:   Problem List Items Addressed This Visit       Other   Screening for lipid disorders - Primary    Mother has not early history of coronary artery disease.  We will screen for lipids in patient.      Relevant Orders   Lipid panel   Oral contraceptive use    Patient is followed by GYN.      Other Visit Diagnoses     Need for tetanus booster       Relevant Orders   Td : Tetanus/diphtheria >7yo Preservative  free (Completed)   Need for influenza vaccination       Relevant Orders   Flu Vaccine QUAD 6+ mos PF IM (Fluarix Quad PF) (Completed)       Return in about 1 year (around 07/15/2023).    Romilda Garret, NP

## 2022-07-14 NOTE — Assessment & Plan Note (Signed)
Mother has not early history of coronary artery disease.  We will screen for lipids in patient.

## 2022-07-14 NOTE — Assessment & Plan Note (Signed)
Patient is followed by GYN.

## 2022-07-29 DIAGNOSIS — Z713 Dietary counseling and surveillance: Secondary | ICD-10-CM | POA: Diagnosis not present

## 2022-08-09 ENCOUNTER — Encounter: Payer: Self-pay | Admitting: Nurse Practitioner

## 2022-08-09 ENCOUNTER — Ambulatory Visit (INDEPENDENT_AMBULATORY_CARE_PROVIDER_SITE_OTHER)
Admission: RE | Admit: 2022-08-09 | Discharge: 2022-08-09 | Disposition: A | Discharge status: Home / Self Care | Payer: BC Managed Care – PPO | Source: Ambulatory Visit | Attending: Nurse Practitioner | Admitting: Nurse Practitioner

## 2022-08-09 ENCOUNTER — Ambulatory Visit: Payer: BC Managed Care – PPO

## 2022-08-09 ENCOUNTER — Other Ambulatory Visit: Payer: Self-pay

## 2022-08-09 ENCOUNTER — Ambulatory Visit: Payer: BC Managed Care – PPO | Admitting: Nurse Practitioner

## 2022-08-09 VITALS — BP 100/60 | HR 88 | Temp 97.7°F | Ht 60.0 in | Wt 132.2 lb

## 2022-08-09 DIAGNOSIS — R0689 Other abnormalities of breathing: Secondary | ICD-10-CM | POA: Insufficient documentation

## 2022-08-09 DIAGNOSIS — R059 Cough, unspecified: Secondary | ICD-10-CM | POA: Diagnosis not present

## 2022-08-09 DIAGNOSIS — R051 Acute cough: Secondary | ICD-10-CM | POA: Diagnosis not present

## 2022-08-09 MED ORDER — BENZONATATE 200 MG PO CAPS
200.0000 mg | ORAL_CAPSULE | Freq: Three times a day (TID) | ORAL | 0 refills | Status: DC | PRN
  Filled 2022-08-09: qty 21, 7d supply, fill #0

## 2022-08-09 MED ORDER — FLUTICASONE PROPIONATE 50 MCG/ACT NA SUSP
2.0000 | Freq: Every day | NASAL | 0 refills | Status: DC
  Filled 2022-08-09: qty 16, 30d supply, fill #0

## 2022-08-09 NOTE — Patient Instructions (Signed)
Nice to see you today I will be in touch with the xray once I have the results Follow up if no improvement 

## 2022-08-09 NOTE — Assessment & Plan Note (Signed)
Pending chest x-ray.

## 2022-08-09 NOTE — Assessment & Plan Note (Signed)
Has been going on approximately 8 days.  This started after COVID.  Pending chest x-ray.  We will start patient on Tessalon Perles 200 mg 3 times daily as needed.  Pending x-ray result may consider starting oral antibiotics also likely to use azithromycin

## 2022-08-09 NOTE — Progress Notes (Signed)
Acute Office Visit  Subjective:     Patient ID: Breanna Cook, female    DOB: [birth year], 21 y.o.   MRN: [id number]  Chief Complaint  Patient presents with   Cough    Productive with yellow mucous. Post covid x 2 weeks   Night Sweats     Patient is in today for cough  States that she had covid over 2 weeks ago. States that she did ride the illness out. Seems mild per patient report. States a few days after she started feeling better. Cough started on 08/31/2022. States that she has felt poor a couple day. States that she is using cough drops that help. She has tried over the counter cough medicaiton that is not helping. States that the cough is the same better int he am and worse at night    Review of Systems  Constitutional:  Negative for chills, fever and malaise/fatigue.  HENT:  Negative for ear discharge, ear pain, sinus pain and sore throat (irritated).   Respiratory:  Positive for cough and sputum production (yellowish.). Negative for shortness of breath and wheezing.   Neurological:  Positive for headaches (post tussive).        Objective:    BP 100/60   Pulse 88   Temp 97.7 F (36.5 C) (Temporal)   Ht 5' (1.524 m)   Wt 132 lb 4 oz (60 kg)   LMP 08/04/2022   SpO2 100%   BMI 25.83 kg/m    Physical Exam Vitals and nursing note reviewed.  Constitutional:      Appearance: Normal appearance.  HENT:     Right Ear: Tympanic membrane, ear canal and external ear normal.     Left Ear: Tympanic membrane, ear canal and external ear normal.     Nose:     Right Sinus: No maxillary sinus tenderness or frontal sinus tenderness.     Left Sinus: No maxillary sinus tenderness or frontal sinus tenderness.     Mouth/Throat:     Mouth: Mucous membranes are moist.     Comments: cobblestoning Cardiovascular:     Rate and Rhythm: Normal rate and regular rhythm.     Heart sounds: Normal heart sounds.  Pulmonary:     Effort: Pulmonary effort is normal.     Breath sounds:  Rales (RLQ that cleared with cough) present.  Lymphadenopathy:     Cervical: No cervical adenopathy.  Neurological:     Mental Status: She is alert.     No results found for any visits on 08/09/22.      Assessment & Plan:   Problem List Items Addressed This Visit       Other   Acute cough - Primary    Has been going on approximately 8 days.  This started after COVID.  Pending chest x-ray.  We will start patient on Tessalon Perles 200 mg 3 times daily as needed.  Pending x-ray result may consider starting oral antibiotics also likely to use azithromycin      Relevant Medications   benzonatate (TESSALON) 200 MG capsule   fluticasone (FLONASE) 50 MCG/ACT nasal spray   Adventitious breath sounds    Pending chest x-ray.      Relevant Orders   DG Chest 2 View    Meds ordered this encounter  Medications   benzonatate (TESSALON) 200 MG capsule    Sig: Take 1 capsule (200 mg total) by mouth 3 (three) times daily as needed for cough.    Dispense:  21 capsule    Refill:  0    Order Specific Question:   Supervising Provider    Answer:   Glori Bickers MARNE A [[id number]]   fluticasone (FLONASE) 50 MCG/ACT nasal spray    Sig: Place 2 sprays into both nostrils daily.    Dispense:  16 g    Refill:  0    Order Specific Question:   Supervising Provider    Answer:   TOWER, MARNE A [[id number]]    Return if symptoms worsen or fail to improve.  Romilda Garret, NP

## 2022-08-11 ENCOUNTER — Other Ambulatory Visit: Payer: Self-pay

## 2022-08-11 ENCOUNTER — Encounter: Payer: Self-pay | Admitting: Nurse Practitioner

## 2022-08-11 ENCOUNTER — Other Ambulatory Visit: Payer: Self-pay | Admitting: Nurse Practitioner

## 2022-08-11 DIAGNOSIS — J189 Pneumonia, unspecified organism: Secondary | ICD-10-CM

## 2022-08-11 MED ORDER — AMOXICILLIN-POT CLAVULANATE 875-125 MG PO TABS
1.0000 | ORAL_TABLET | Freq: Two times a day (BID) | ORAL | 0 refills | Status: AC
  Filled 2022-08-11: qty 20, 10d supply, fill #0

## 2022-10-04 ENCOUNTER — Other Ambulatory Visit (HOSPITAL_COMMUNITY)
Admission: RE | Admit: 2022-10-04 | Discharge: 2022-10-04 | Disposition: A | Discharge status: Home / Self Care | Payer: BC Managed Care – PPO | Source: Ambulatory Visit | Attending: Obstetrics & Gynecology | Admitting: Obstetrics & Gynecology

## 2022-10-04 ENCOUNTER — Encounter: Payer: Self-pay | Admitting: Obstetrics & Gynecology

## 2022-10-04 ENCOUNTER — Ambulatory Visit (INDEPENDENT_AMBULATORY_CARE_PROVIDER_SITE_OTHER): Payer: BC Managed Care – PPO | Admitting: Obstetrics & Gynecology

## 2022-10-04 VITALS — BP 117/79 | HR 78 | Wt 132.0 lb

## 2022-10-04 DIAGNOSIS — Z01419 Encounter for gynecological examination (general) (routine) without abnormal findings: Secondary | ICD-10-CM

## 2022-10-04 DIAGNOSIS — Z975 Presence of (intrauterine) contraceptive device: Secondary | ICD-10-CM | POA: Diagnosis not present

## 2022-10-04 NOTE — Progress Notes (Signed)
GYNECOLOGY ANNUAL PREVENTATIVE CARE ENCOUNTER NOTE  History:     Breanna Cook is a 21 y.o. G53 female here for a routine annual gynecologic exam.  Current complaints: none.  Nexplanon in place, sometimes she skips monthly periods, but now she is having spotting.  This is not bothersome to her. In monogamous relationship, not concerned about STIs.  Denies abnormal vaginal discharge, pelvic pain, problems with intercourse or other gynecologic concerns.    Gynecologic History No LMP recorded (exact date). Patient has had an implant. 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     Multiple Vitamin (MULTIVITAMIN ADULT PO) Take by mouth.     benzonatate (TESSALON) 200 MG capsule Take 1 capsule (200 mg total) by mouth 3 (three) times daily as needed for cough. 21 capsule 0   clindamycin (CLEOCIN T) 1 % external solution Apply  a small amount to skin twice a day 30 mL 5   fluticasone (FLONASE) 50 MCG/ACT nasal spray Place 2 sprays into both nostrils daily. (Patient not taking: Reported on 10/04/2022) 16 g 0   No current facility-administered medications on file prior to visit.    No Known Allergies  Social History:  reports that  she has never smoked. She has never used smokeless tobacco. She reports current alcohol use. She reports that she does not use drugs.  Family History  Problem Relation Age of Onset   Diabetes Mother        Type 1   Other Mother        AMI- in her 68's   Heart attack Mother        in her 29s    Hernia Father    Anxiety disorder Maternal Grandmother    Depression Maternal Grandmother    Mental illness Maternal Grandmother    Depression Maternal Grandfather    Parkinson's disease Maternal Grandfather    Heart attack Paternal Grandmother    Breast cancer Other        dads grandmother ?    The following portions of the patient's history were reviewed and updated as appropriate: allergies, current medications, past family history, past medical history, past social history, past surgical history and problem list.  Review of Systems Pertinent items noted in HPI and remainder of comprehensive ROS otherwise negative.  Physical Exam:  BP 117/79   Pulse 78   Wt 132 lb (59.9 kg)   LMP  (Exact Date)   BMI 25.78  kg/m  CONSTITUTIONAL: Well-developed, well-nourished female in no acute distress.  HENT:  Normocephalic, atraumatic, External right and left ear normal.  EYES: Conjunctivae and EOM are normal. Pupils are equal, round, and reactive to light. No scleral icterus.  NECK: Normal range of motion, supple, no masses.  Normal thyroid.  SKIN: Skin is warm and dry. No rash noted. Not diaphoretic. No erythema. No pallor. MUSCULOSKELETAL: Normal range of motion. No tenderness.  No cyanosis, clubbing, or edema. NEUROLOGIC: Alert and oriented to person, place, and time. Normal reflexes, muscle tone coordination.  PSYCHIATRIC: Normal mood and affect. Normal behavior. Normal judgment and thought content. CARDIOVASCULAR: Normal heart rate noted, regular rhythm RESPIRATORY: Clear to auscultation bilaterally. Effort and breath sounds normal, no problems with respiration noted. BREASTS: Symmetric in  size. No masses, tenderness, skin changes, nipple drainage, or lymphadenopathy bilaterally. Performed in the presence of a chaperone. ABDOMEN: Soft, no distention noted.  No tenderness, rebound or guarding.  PELVIC: Normal appearing external genitalia and urethral meatus; normal appearing vaginal mucosa and cervix.  No abnormal vaginal discharge noted.  Pap smear obtained.  Normal uterine size, no other palpable masses, no uterine or adnexal tenderness.  Performed in the presence of a chaperone.   Assessment and Plan:    1. Nexplanon in place since 04/20/2021 No concerns. Due for replacement in 2026 or earlier if needed.  2. Well woman exam with routine gynecological exam - Cytology - PAP( [place]) Will follow up results of pap smear and manage accordingly. Routine preventative health maintenance measures emphasized. Please refer to After Visit Summary for other counseling recommendations.      Verita Schneiders, MD, [place] for [employer], [place]

## 2022-10-05 LAB — CYTOLOGY - PAP
Chlamydia: NEGATIVE
Comment: NEGATIVE
Comment: NORMAL
Diagnosis: NEGATIVE
Neisseria Gonorrhea: NEGATIVE

## 2022-11-22 ENCOUNTER — Ambulatory Visit (INDEPENDENT_AMBULATORY_CARE_PROVIDER_SITE_OTHER)
Admission: RE | Admit: 2022-11-22 | Discharge: 2022-11-22 | Disposition: A | Discharge status: Home / Self Care | Payer: BC Managed Care – PPO | Source: Ambulatory Visit | Attending: Nurse Practitioner | Admitting: Nurse Practitioner

## 2022-11-22 DIAGNOSIS — J189 Pneumonia, unspecified organism: Secondary | ICD-10-CM | POA: Diagnosis not present

## 2023-01-10 ENCOUNTER — Ambulatory Visit: Payer: BC Managed Care – PPO | Admitting: Obstetrics & Gynecology

## 2023-01-10 ENCOUNTER — Encounter: Payer: Self-pay | Admitting: Obstetrics & Gynecology

## 2023-01-10 ENCOUNTER — Other Ambulatory Visit: Payer: Self-pay

## 2023-01-10 VITALS — BP 117/80 | HR 111 | Wt 131.0 lb

## 2023-01-10 DIAGNOSIS — Z3046 Encounter for surveillance of implantable subdermal contraceptive: Secondary | ICD-10-CM

## 2023-01-10 DIAGNOSIS — Z975 Presence of (intrauterine) contraceptive device: Secondary | ICD-10-CM

## 2023-01-10 DIAGNOSIS — N921 Excessive and frequent menstruation with irregular cycle: Secondary | ICD-10-CM | POA: Diagnosis not present

## 2023-01-10 MED ORDER — NORGESTIMATE-ETH ESTRADIOL 0.25-35 MG-MCG PO TABS
1.0000 | ORAL_TABLET | Freq: Every day | ORAL | 11 refills | Status: DC
  Filled 2023-01-10: qty 28, 28d supply, fill #0
  Filled 2023-02-06: qty 28, 28d supply, fill #1
  Filled 2023-03-09: qty 28, 28d supply, fill #2
  Filled 2023-04-05: qty 28, 28d supply, fill #3
  Filled 2023-05-01: qty 28, 28d supply, fill #4
  Filled 2023-06-05: qty 28, 28d supply, fill #5
  Filled 2023-07-25: qty 28, 28d supply, fill #6

## 2023-01-10 NOTE — Progress Notes (Signed)
   GYNECOLOGY OFFICE VISIT NOTE  History:   Breanna Cook is a 22 y.o. G0 here today for discussion about management of breakthrough bleeding on Nexplanon.  Placed 04/20/21, has irregular bleeding. Last episode of bleeding lasted several weeks. Does not want to remove it but is wondering if she can try pills on top of the Nexplanon. She denies any abnormal vaginal discharge, bleeding, pelvic pain or other concerns.    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   Health Maintenance:  Normal pap on 10/04/2022.    Review of Systems:  Pertinent items noted in HPI and remainder of comprehensive ROS otherwise negative.  Physical Exam:  BP 117/80   Pulse (!) 111   Wt 131 lb (59.4 kg)   BMI 25.58 kg/m  CONSTITUTIONAL: Well-developed, well-nourished female in no acute distress.  HEENT:  Normocephalic, atraumatic. External right and left ear normal. No scleral icterus.  NECK: Normal range of motion, supple, no masses noted on observation SKIN: No rash noted. Not diaphoretic. No erythema. No pallor. MUSCULOSKELETAL: Normal range of motion. No edema noted. NEUROLOGIC: Alert and oriented to person, place, and time. Normal muscle tone coordination. No cranial nerve deficit noted. PSYCHIATRIC: Normal mood and affect. Normal behavior. Normal judgment and thought  content. CARDIOVASCULAR: Normal heart rate noted RESPIRATORY: Effort and breath sounds normal, no problems with respiration noted ABDOMEN: No masses noted. No other overt distention noted.   PELVIC: Deferred     Assessment and Plan:    1. Breakthrough bleeding on Nexplanon Discussed that OCPs are a great way to treat BTB caused by Nexplanon. Will use for 2-3 months then stop and see effect. If BTB continues, will discuss other options. - norgestimate-ethinyl estradiol (ORTHO-CYCLEN) 0.25-35 MG-MCG tablet; Take 1 tablet by mouth daily.  Dispense: 28 tablet; Refill: 11  Routine preventative health maintenance measures emphasized. Please refer to After Visit Summary for other counseling recommendations.   Return for any gynecologic concerns.    I spent 25 minutes dedicated to the care of this patient including pre-visit review of records, face to face time with the patient discussing her conditions and treatments and post visit orders.    Verita Schneiders, MD, [place] for [employer], [place]

## 2023-02-06 ENCOUNTER — Other Ambulatory Visit: Payer: Self-pay

## 2023-02-21 DIAGNOSIS — I889 Nonspecific lymphadenitis, unspecified: Secondary | ICD-10-CM | POA: Diagnosis not present

## 2023-02-27 DIAGNOSIS — R591 Generalized enlarged lymph nodes: Secondary | ICD-10-CM | POA: Diagnosis not present

## 2023-02-27 DIAGNOSIS — R590 Localized enlarged lymph nodes: Secondary | ICD-10-CM | POA: Diagnosis not present

## 2023-02-27 DIAGNOSIS — D72819 Decreased white blood cell count, unspecified: Secondary | ICD-10-CM | POA: Diagnosis not present

## 2023-02-27 DIAGNOSIS — M2550 Pain in unspecified joint: Secondary | ICD-10-CM | POA: Diagnosis not present

## 2023-02-28 DIAGNOSIS — R590 Localized enlarged lymph nodes: Secondary | ICD-10-CM | POA: Diagnosis not present

## 2023-02-28 DIAGNOSIS — D72819 Decreased white blood cell count, unspecified: Secondary | ICD-10-CM | POA: Diagnosis not present

## 2023-02-28 DIAGNOSIS — R591 Generalized enlarged lymph nodes: Secondary | ICD-10-CM | POA: Diagnosis not present

## 2023-02-28 DIAGNOSIS — M2550 Pain in unspecified joint: Secondary | ICD-10-CM | POA: Diagnosis not present

## 2023-03-09 ENCOUNTER — Other Ambulatory Visit: Payer: Self-pay

## 2023-03-21 DIAGNOSIS — R600 Localized edema: Secondary | ICD-10-CM | POA: Diagnosis not present

## 2023-03-21 DIAGNOSIS — R591 Generalized enlarged lymph nodes: Secondary | ICD-10-CM | POA: Diagnosis not present

## 2023-03-24 DIAGNOSIS — R0602 Shortness of breath: Secondary | ICD-10-CM | POA: Diagnosis not present

## 2023-03-24 DIAGNOSIS — R591 Generalized enlarged lymph nodes: Secondary | ICD-10-CM | POA: Diagnosis not present

## 2023-03-24 DIAGNOSIS — R590 Localized enlarged lymph nodes: Secondary | ICD-10-CM | POA: Diagnosis not present

## 2023-04-05 ENCOUNTER — Other Ambulatory Visit: Payer: Self-pay

## 2023-04-06 ENCOUNTER — Encounter: Payer: Self-pay | Admitting: Nurse Practitioner

## 2023-04-06 ENCOUNTER — Ambulatory Visit: Payer: BC Managed Care – PPO | Admitting: Nurse Practitioner

## 2023-04-06 VITALS — BP 112/64 | HR 87 | Temp 98.3°F | Resp 16 | Ht 60.0 in | Wt 132.0 lb

## 2023-04-06 DIAGNOSIS — M25562 Pain in left knee: Secondary | ICD-10-CM | POA: Insufficient documentation

## 2023-04-06 NOTE — Patient Instructions (Signed)
Nice to see you today I would use anti inflammatories for the next 5 days. You can do ibuprofen 400mg  2-3 times a day. Take with food. I would get a knee sleeve to help with supporting the knee Follow up if no improvement

## 2023-04-06 NOTE — Assessment & Plan Note (Signed)
No discrete injury ambiguous in nature.  Patient can do 400 mg ibuprofen 2-3 times a day for the next 5 days to Decrease any inflammation that is not present.  Also recommend using a knee sleeve for added support follow-up with sports medicine if no improvement.  Defer x-ray today as no overt injury patient was in agreement

## 2023-04-06 NOTE — Progress Notes (Signed)
   Acute Office Visit  Subjective:     Patient ID: Breanna Cook, female    DOB: [birth year], 22 y.o.   MRN: [id number]  Chief Complaint  Patient presents with   Knee Pain    Left X 2 weeks     Patient is in today for knee pain with a history that is non contributory.  States that she noticed that it stated about 2 weeks. States no injury. States that she went to bed and woke up and bending it was bother.  States that when she stays it hurts with flextion. States that it is sharp. States that she has not tired anything otc for it thus far. States no weakness or numbness/tingling  States that she was doing the running club but has not done that in a wihle and the knee started months after she completed the running club.   Review of Systems  Constitutional:  Negative for chills and fever.  Musculoskeletal:  Positive for joint pain.  Neurological:  Negative for tingling and weakness.        Objective:    BP 112/64   Pulse 87   Temp 98.3 F (36.8 C)   Resp 16   Ht 5' (1.524 m)   Wt 132 lb (59.9 kg)   SpO2 100%   BMI 25.78 kg/m    Physical Exam Vitals and nursing note reviewed.  Constitutional:      Appearance: Normal appearance.  Cardiovascular:     Rate and Rhythm: Normal rate and regular rhythm.     Heart sounds: Normal heart sounds.  Pulmonary:     Effort: Pulmonary effort is normal.     Breath sounds: Normal breath sounds.  Musculoskeletal:        General: No swelling, tenderness or signs of injury.     Right knee: No bony tenderness. Normal range of motion. No tenderness. Normal pulse.     Left knee: No bony tenderness. Normal range of motion. No tenderness. Normal pulse.     Right lower leg: No edema.     Left lower leg: No edema.       Legs:     Comments: Bilateral lower extremity strength 5/5  Neurological:     Mental Status: She is alert.     No results found for any visits on 04/06/23.      Assessment & Plan:   Problem List Items Addressed  This Visit       Other   Acute pain of left knee - Primary    No discrete injury ambiguous in nature.  Patient can do 400 mg ibuprofen 2-3 times a day for the next 5 days to Decrease any inflammation that is not present.  Also recommend using a knee sleeve for added support follow-up with sports medicine if no improvement.  Defer x-ray today as no overt injury patient was in agreement       No orders of the defined types were placed in this encounter.   Return if symptoms worsen or fail to improve.  Audria Nine, NP

## 2023-04-10 DIAGNOSIS — I890 Lymphedema, not elsewhere classified: Secondary | ICD-10-CM | POA: Diagnosis not present

## 2023-05-02 DIAGNOSIS — R590 Localized enlarged lymph nodes: Secondary | ICD-10-CM | POA: Diagnosis not present

## 2023-05-02 DIAGNOSIS — R0982 Postnasal drip: Secondary | ICD-10-CM | POA: Diagnosis not present

## 2023-05-02 DIAGNOSIS — Z0000 Encounter for general adult medical examination without abnormal findings: Secondary | ICD-10-CM | POA: Diagnosis not present

## 2023-05-02 DIAGNOSIS — F419 Anxiety disorder, unspecified: Secondary | ICD-10-CM | POA: Diagnosis not present

## 2023-05-02 DIAGNOSIS — R5383 Other fatigue: Secondary | ICD-10-CM | POA: Diagnosis not present

## 2023-05-04 DIAGNOSIS — Z01419 Encounter for gynecological examination (general) (routine) without abnormal findings: Secondary | ICD-10-CM | POA: Diagnosis not present

## 2023-05-04 DIAGNOSIS — Z113 Encounter for screening for infections with a predominantly sexual mode of transmission: Secondary | ICD-10-CM | POA: Diagnosis not present

## 2023-05-04 DIAGNOSIS — Z975 Presence of (intrauterine) contraceptive device: Secondary | ICD-10-CM | POA: Diagnosis not present

## 2023-05-04 DIAGNOSIS — N921 Excessive and frequent menstruation with irregular cycle: Secondary | ICD-10-CM | POA: Diagnosis not present

## 2023-06-13 DIAGNOSIS — R21 Rash and other nonspecific skin eruption: Secondary | ICD-10-CM | POA: Diagnosis not present

## 2023-06-13 DIAGNOSIS — E559 Vitamin D deficiency, unspecified: Secondary | ICD-10-CM | POA: Diagnosis not present

## 2023-06-20 DIAGNOSIS — Z713 Dietary counseling and surveillance: Secondary | ICD-10-CM | POA: Diagnosis not present

## 2023-08-01 DIAGNOSIS — L700 Acne vulgaris: Secondary | ICD-10-CM | POA: Diagnosis not present

## 2023-08-01 DIAGNOSIS — L858 Other specified epidermal thickening: Secondary | ICD-10-CM | POA: Diagnosis not present

## 2023-08-01 DIAGNOSIS — D2339 Other benign neoplasm of skin of other parts of face: Secondary | ICD-10-CM | POA: Diagnosis not present

## 2023-08-23 DIAGNOSIS — L989 Disorder of the skin and subcutaneous tissue, unspecified: Secondary | ICD-10-CM | POA: Diagnosis not present

## 2023-08-31 ENCOUNTER — Other Ambulatory Visit: Payer: Self-pay

## 2023-09-12 DIAGNOSIS — F4320 Adjustment disorder, unspecified: Secondary | ICD-10-CM | POA: Diagnosis not present

## 2023-09-20 DIAGNOSIS — D2339 Other benign neoplasm of skin of other parts of face: Secondary | ICD-10-CM | POA: Diagnosis not present

## 2023-09-26 DIAGNOSIS — F4320 Adjustment disorder, unspecified: Secondary | ICD-10-CM | POA: Diagnosis not present

## 2023-10-03 DIAGNOSIS — F4320 Adjustment disorder, unspecified: Secondary | ICD-10-CM | POA: Diagnosis not present

## 2023-10-10 DIAGNOSIS — F4320 Adjustment disorder, unspecified: Secondary | ICD-10-CM | POA: Diagnosis not present

## 2023-10-19 DIAGNOSIS — F4320 Adjustment disorder, unspecified: Secondary | ICD-10-CM | POA: Diagnosis not present

## 2023-11-08 DIAGNOSIS — F4320 Adjustment disorder, unspecified: Secondary | ICD-10-CM | POA: Diagnosis not present

## 2023-11-14 DIAGNOSIS — F4320 Adjustment disorder, unspecified: Secondary | ICD-10-CM | POA: Diagnosis not present

## 2023-11-21 DIAGNOSIS — F4320 Adjustment disorder, unspecified: Secondary | ICD-10-CM | POA: Diagnosis not present

## 2023-11-28 DIAGNOSIS — F4320 Adjustment disorder, unspecified: Secondary | ICD-10-CM | POA: Diagnosis not present

## 2023-12-01 ENCOUNTER — Encounter: Payer: Self-pay | Admitting: Obstetrics & Gynecology

## 2023-12-05 DIAGNOSIS — F4320 Adjustment disorder, unspecified: Secondary | ICD-10-CM | POA: Diagnosis not present

## 2023-12-12 DIAGNOSIS — F4320 Adjustment disorder, unspecified: Secondary | ICD-10-CM | POA: Diagnosis not present

## 2023-12-19 DIAGNOSIS — F4320 Adjustment disorder, unspecified: Secondary | ICD-10-CM | POA: Diagnosis not present

## 2023-12-26 DIAGNOSIS — F4320 Adjustment disorder, unspecified: Secondary | ICD-10-CM | POA: Diagnosis not present

## 2024-01-02 DIAGNOSIS — F4320 Adjustment disorder, unspecified: Secondary | ICD-10-CM | POA: Diagnosis not present

## 2024-01-09 DIAGNOSIS — F4320 Adjustment disorder, unspecified: Secondary | ICD-10-CM | POA: Diagnosis not present

## 2024-01-23 DIAGNOSIS — F4320 Adjustment disorder, unspecified: Secondary | ICD-10-CM | POA: Diagnosis not present

## 2024-02-06 DIAGNOSIS — F4320 Adjustment disorder, unspecified: Secondary | ICD-10-CM | POA: Diagnosis not present

## 2024-03-05 DIAGNOSIS — F4320 Adjustment disorder, unspecified: Secondary | ICD-10-CM | POA: Diagnosis not present

## 2024-04-02 DIAGNOSIS — F4320 Adjustment disorder, unspecified: Secondary | ICD-10-CM | POA: Diagnosis not present

## 2024-04-30 DIAGNOSIS — F4320 Adjustment disorder, unspecified: Secondary | ICD-10-CM | POA: Diagnosis not present

## 2024-05-07 ENCOUNTER — Encounter: Payer: Self-pay | Admitting: Obstetrics & Gynecology

## 2024-05-07 ENCOUNTER — Ambulatory Visit: Admitting: Obstetrics & Gynecology

## 2024-05-07 VITALS — BP 120/79 | HR 88 | Wt 138.0 lb

## 2024-05-07 DIAGNOSIS — Z3046 Encounter for surveillance of implantable subdermal contraceptive: Secondary | ICD-10-CM | POA: Diagnosis not present

## 2024-05-07 NOTE — Patient Instructions (Signed)
Nexplanon Instructions After Removal  Keep bandage clean and dry for 24 hours  May use ice/Tylenol/Ibuprofen for soreness or pain  If you develop fever, drainage or increased warmth from incision site-contact office immediately   

## 2024-05-07 NOTE — Progress Notes (Signed)
    GYNECOLOGY OFFICE PROCEDURE NOTE  Breanna Cook is a 23 y.o. G0P0000 here for Nexplanon  removal  had a lot of breakthrough bleeding at the end, this was placed in June 2022.  Last pap smear was on 10/04/2022 and was normal.  No other gynecologic concerns.  Nexplanon  Removal Patient identified, informed consent performed, consent signed.   Appropriate time out taken. Nexplanon  site identified.  Area prepped in usual sterile fashon. One ml of 1% lidocaine was used to anesthetize the area at the distal end of the implant. A small stab incision was made right beside the implant on the distal portion.  The Nexplanon  rod was grasped using hemostats and removed without difficulty.  There was minimal blood loss. There were no complications.  3 ml of 1% lidocaine was injected around the incision for post-procedure analgesia.  Steri-strips were applied over the small incision.  A pressure bandage was applied to reduce any bruising.  The patient tolerated the procedure well and was given post procedure instructions.  Patient is planning to use condoms, fertility awareness method for contraception/attempt conception.  GLORIS HUGGER, MD, FACOG Obstetrician & Gynecologist, [facility] for [employer], [facility]

## 2024-05-24 DIAGNOSIS — Z713 Dietary counseling and surveillance: Secondary | ICD-10-CM | POA: Diagnosis not present

## 2024-05-28 DIAGNOSIS — F4320 Adjustment disorder, unspecified: Secondary | ICD-10-CM | POA: Diagnosis not present

## 2024-08-20 DIAGNOSIS — F4320 Adjustment disorder, unspecified: Secondary | ICD-10-CM | POA: Diagnosis not present

## 2024-08-30 ENCOUNTER — Telehealth: Payer: Self-pay

## 2024-08-30 NOTE — Telephone Encounter (Signed)
Noted. I am fine with this.

## 2024-08-30 NOTE — Telephone Encounter (Signed)
 FYI for [employer].    Copied from CRM [id number]. Topic: Appointments - Transfer of Care >> Aug 30, 2024  9:07 AM Amy B wrote: Pt is requesting to transfer FROM: [facility] Pt is requesting to transfer TO: [facility] Reason for requested transfer: provider closer to home It is the responsibility of the team the patient would like to transfer to [facility]) to reach out to the patient if for any reason this transfer is not acceptable.

## 2024-09-17 DIAGNOSIS — F4320 Adjustment disorder, unspecified: Secondary | ICD-10-CM | POA: Diagnosis not present

## 2024-10-14 ENCOUNTER — Ambulatory Visit: Payer: Self-pay

## 2024-10-14 NOTE — Telephone Encounter (Signed)
 FYI Only or Action Required?: FYI only for provider: appointment scheduled on 10/15/24.  Patient was last seen in primary care on 04/06/2023 by Wendee Lynwood HERO, NP.  Called Nurse Triage reporting Ankle Injury.  Symptoms began several days ago.  Interventions attempted: OTC medications: ibuprofen.  Symptoms are: unchanged.  Triage Disposition: See PCP When Office is Open (Within 3 Days)  Patient/caregiver understands and will follow disposition?: Yes  Copied from CRM [id number]. Topic: Clinical - Red Word Triage >> Oct 14, 2024  9:07 AM Thersia BROCKS wrote: [facility] that prompted transfer to Nurse Triage: Patient thinks she sprung ankle, swollen and painful to move ankle certain ways    ----------------------------------------------------------------------- From previous Reason for Contact - Scheduling: Patient/patient representative is calling to schedule an appointment. Refer to attachments for appointment information. Reason for Disposition  [1] After 3 days AND [2] pain not improved  Answer Assessment - Initial Assessment Questions Scheduled alt prov 10/15/24  Advised call back UC/ED if symptoms worsen. Patient verbalized understanding.  1. MECHANISM: How did the injury happen? (e.g., twisting injury, direct blow)      Walking downstairs, missed step, rolled over on ankle, fall; denies Head injuries 2. ONSET: When did the injury happen? (e.g., minutes or hours ago)      Saturday 3. LOCATION: Where is the injury located?      Left leg 4. APPEARANCE of INJURY: What does the injury look like?      Swelling; mild denies redness or discolored, hot to touch 5. WEIGHT-BEARING: Can you put weight on that foot? Can you walk (four steps or more)?       Yes can walk 6. SIZE: For cuts, bruises, or swelling, ask: How large is it? (e.g., inches or centimeters; entire joint)      deneis 7. PAIN: Is there pain? If Yes, ask: How bad is the pain?  What does it keep you from  doing? (Scale 0-10; or none, mild, moderate, severe)     1/10; took Ibuprofen 9. OTHER SYMPTOMS: Do you have any other symptoms?      denies  Protocols used: Ankle Injury-A-AH

## 2024-10-14 NOTE — Telephone Encounter (Signed)
 Noted

## 2024-10-14 NOTE — Telephone Encounter (Signed)
 Called disconnected prior to warm transfer. Nurse will attempt to contact patient.

## 2024-10-14 NOTE — Telephone Encounter (Signed)
 She if she can get rescheduled due to weather delay tomorrow.  Thanks.

## 2024-10-15 ENCOUNTER — Ambulatory Visit: Admitting: Family Medicine

## 2024-10-15 ENCOUNTER — Encounter: Payer: Self-pay | Admitting: Primary Care

## 2024-10-15 ENCOUNTER — Ambulatory Visit: Admitting: Primary Care

## 2024-10-15 ENCOUNTER — Ambulatory Visit
Admission: RE | Admit: 2024-10-15 | Discharge: 2024-10-15 | Disposition: A | Source: Ambulatory Visit | Attending: Primary Care

## 2024-10-15 VITALS — BP 114/72 | HR 108 | Temp 98.4°F | Ht 60.0 in | Wt 135.1 lb

## 2024-10-15 DIAGNOSIS — S96912A Strain of unspecified muscle and tendon at ankle and foot level, left foot, initial encounter: Secondary | ICD-10-CM | POA: Insufficient documentation

## 2024-10-15 DIAGNOSIS — Z87898 Personal history of other specified conditions: Secondary | ICD-10-CM | POA: Insufficient documentation

## 2024-10-15 DIAGNOSIS — F4320 Adjustment disorder, unspecified: Secondary | ICD-10-CM | POA: Diagnosis not present

## 2024-10-15 DIAGNOSIS — S99912A Unspecified injury of left ankle, initial encounter: Secondary | ICD-10-CM | POA: Diagnosis not present

## 2024-10-15 NOTE — Assessment & Plan Note (Signed)
 Appears to be healing well today.  We discussed that based on her presentation and rapid improvement that an x-ray would likely not be warranted. She requests x-ray for further evaluation. X-ray ordered and pending.  We discussed to continue conservative treatment such as ibuprofen, elevation, ice, compression.

## 2024-10-15 NOTE — Progress Notes (Signed)
 Subjective:    Patient ID: Breanna Cook, female    DOB: [birth year], 23 y.o.   MRN: [id number]  Breanna Cook is a very pleasant 23 y.o. female patient of Matt, NP who presents today to discuss ankle pain.  Symptom onset three days ago with acute left ankle pain to the left malleolus region. She was at a movie theater walking down the stairs and rolled her ankle. Initially, she noticed swelling, applied ice and elevated with improvement. Her pain has improved, is able to ambulate well, but does notice her pain with certain movements such as walking up the stairs.   Just after rolling her ankle she felt squeamish and passed out for a few seconds.  Her fianc caught her and witnessed the episode.  She has a history of syncope during teenage years.  She was evaluated for such and there was no obvious cause except for dehydration.  She believes that she passed out because of her ankle injury.  She denies syncope since the injury and feels well.  She's been taking Ibuprofen with improvement.    Review of Systems  Musculoskeletal:  Positive for arthralgias and joint swelling.  Skin:  Negative for color change.  Neurological:  Negative for dizziness and light-headedness.         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 2-3 times a month. 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 Works for [employer]: Not on [employer] Insecurity: Not on file  Transportation Needs: Not on file  Physical Activity: Not on file  Stress: Not on file  Social Connections: Not on file  Intimate Partner Violence: Not on file    Past Surgical History:  Procedure Laterality Date   ADENOIDECTOMY     still with tonsils 2003    EYE SURGERY     r eye in 2010    TYMPANOSTOMY TUBE PLACEMENT     X 3   WISDOM TOOTH EXTRACTION     2019    Family History  Problem Relation Age of Onset   Diabetes Mother        Type 1   Other Mother        AMI- in her 60's   Heart attack Mother  in her 14s    Hernia Father    Anxiety disorder Maternal Grandmother    Depression Maternal Grandmother    Mental illness Maternal Grandmother    Depression Maternal Grandfather    Parkinson's disease Maternal Grandfather    Heart attack Paternal Grandmother    Breast cancer Other        dads grandmother ?    No Known Allergies  No current outpatient medications on file prior to visit.   No current facility-administered medications on file prior to visit.    BP 114/72   Pulse (!) 108   Temp 98.4 F (36.9 C) (Oral)   Ht 5' (1.524 m)   Wt 135 lb 2 oz (61.3 kg)   LMP 10/02/2024   SpO2 100%   BMI 26.39 kg/m  Objective:   Physical Exam Constitutional:      General: She is not in acute distress. Cardiovascular:     Rate and Rhythm: Normal rate and regular rhythm.  Pulmonary:     Effort: Pulmonary effort is normal.     Breath sounds: Normal breath sounds.  Musculoskeletal:     Right ankle: No swelling. Normal range of motion.      Left ankle: Swelling present. No tenderness. Normal range of motion. Normal pulse.     Comments: Mild swelling over left malleolus.  Pain with inversion, good range of motion otherwise.  Skin:    General: Skin is warm and dry.     Findings: No erythema.  Neurological:     Mental Status: She is alert.     Physical Exam        Assessment & Plan:  Ankle strain, left, initial encounter Assessment & Plan: Appears to be healing well today.  We discussed that based on her presentation and rapid improvement that an x-ray would likely not be warranted. She requests x-ray for further evaluation. X-ray ordered and pending.  We discussed to continue conservative treatment such as ibuprofen, elevation, ice, compression.  Orders: -     DG Ankle Complete Left  History of syncope Assessment & Plan: Offered further workup today for which she kindly declines.  She will monitor and report to PCP if symptoms recur Exam today grossly unremarkable.     Assessment and Plan Assessment & Plan         Comer MARLA Gaskins, NP

## 2024-10-15 NOTE — Patient Instructions (Signed)
 Complete xray(s) prior to leaving today. I will notify you of your results once received.  Continue ibuprofen, elevation, ice as needed.  Consider an ankle wrap if needed  It was a pleasure meeting you!

## 2024-10-15 NOTE — Assessment & Plan Note (Signed)
 Offered further workup today for which she kindly declines.  She will monitor and report to PCP if symptoms recur Exam today grossly unremarkable.

## 2024-10-21 ENCOUNTER — Ambulatory Visit: Payer: Self-pay | Admitting: Primary Care

## 2024-11-24 DIAGNOSIS — E7800 Pure hypercholesterolemia, unspecified: Secondary | ICD-10-CM | POA: Insufficient documentation

## 2024-11-24 NOTE — Patient Instructions (Signed)

## 2024-11-29 ENCOUNTER — Encounter: Payer: Self-pay | Admitting: Nurse Practitioner

## 2024-11-29 ENCOUNTER — Ambulatory Visit: Admitting: Nurse Practitioner

## 2024-11-29 VITALS — BP 101/69 | HR 91 | Temp 98.8°F | Ht 61.7 in | Wt 136.2 lb

## 2024-11-29 DIAGNOSIS — M25572 Pain in left ankle and joints of left foot: Secondary | ICD-10-CM | POA: Diagnosis not present

## 2024-11-29 DIAGNOSIS — S93402A Sprain of unspecified ligament of left ankle, initial encounter: Secondary | ICD-10-CM | POA: Insufficient documentation

## 2024-11-29 DIAGNOSIS — E7800 Pure hypercholesterolemia, unspecified: Secondary | ICD-10-CM

## 2024-11-29 DIAGNOSIS — Z7689 Persons encountering health services in other specified circumstances: Secondary | ICD-10-CM

## 2024-11-29 NOTE — Progress Notes (Signed)
 "  New Patient Office Visit  Subjective    Patient ID: Breanna Cook, female    DOB: Nov 15, 2000  Age: 24 y.o. MRN: [id number]  CC:  Chief Complaint  Patient presents with   Ankle Pain    Patient states she hurt her left ankle about a month ago and is still experiencing occasional pains from it. States after she walks a lot, walks in heels, or has it bent certain ways, like sitting in criss cross, then her ankle gets stiff and aches. States she twisted or sprained her ankle while walking down stairs at the movie theater.     HPI Breanna Cook presents for new patient visit to establish care.  Introduced to publishing rights manager role and practice setting.  All questions answered.  Discussed provider/patient relationship and expectations. Transitioning care from [place] in [place].  LEFT ANKLE PAIN Was walking down stairs at movie theater and stepped wrong, sprained ankle. Seen at previous office beginning of December for this and had imaging. No fractures noted, soft tissue swelling only. Still hurts if on feet a lot during the day + sometimes if puts pressure on it a certain way. Has not been wearing heels much lately. Does ballroom dancing classes once a week and wears small heels for this.  Duration: weeks Involved ankle: left Mechanism of injury: as above Location: to lateral aspect Onset: gradual  Severity: mild  Quality:  dull, aching, and throbbing Frequency: intermittent Radiation: no Aggravating factors: weight bearing and movement for extended periods in heels Alleviating factors: Ice, Ibuprofen, and brace  Status: slowly improving Treatments attempted: as above  Relief with NSAIDs?:  moderate Weakness with weight bearing or walking: no Morning stiffness: no Swelling: no Redness: no Bruising: no Paresthesias / decreased sensation: no  Fevers:no      11/29/2024    8:58 AM 10/15/2024    2:25 PM 04/06/2023   10:43 AM 04/15/2022    9:19 AM  Depression screen PHQ 2/9   Decreased Interest 0 0 0 0  Down, Depressed, Hopeless 0 0 0 0  PHQ - 2 Score 0 0 0 0  Altered sleeping 0 0 0   Tired, decreased energy 0 0 0   Change in appetite 0 0 0   Feeling bad or failure about yourself  0 0 0   Trouble concentrating 0 0 0   Moving slowly or fidgety/restless 0 0 0   Suicidal thoughts 0 0 0   PHQ-9 Score 0 0 0    Difficult doing work/chores Not difficult at all  Not difficult at all      Data saved with a previous flowsheet row definition       11/29/2024    8:58 AM 10/15/2024    2:25 PM 04/06/2023   10:44 AM  GAD 7 : Generalized Anxiety Score  Nervous, Anxious, on Edge 0 0  1   Control/stop worrying 0 0  0   Worry too much - different things 1 0  0   Trouble relaxing 0 0  0   Restless 0 0  0   Easily annoyed or irritable 0 0  0   Afraid - awful might happen 0 0  0   Total GAD 7 Score 1 0 1  Anxiety Difficulty Not difficult at all  Not difficult at all     Data saved with a previous flowsheet row definition      No outpatient encounter medications on file as of 11/29/2024.   No facility-administered  encounter medications on file as of 11/29/2024.    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/ stabismus (Dr Neysa)   Syncope    Vasovagal syncope     Past Surgical History:  Procedure Laterality Date   ADENOIDECTOMY     still with tonsils 2003    EYE SURGERY     r eye in 2010    TYMPANOSTOMY TUBE PLACEMENT     X 3   WISDOM TOOTH EXTRACTION     2019    Family History  Problem Relation Age of Onset   Diabetes Mother        Type 1   Other Mother        AMI- in her 30's   Heart attack Mother        in her 81s    Arthritis Mother    Graves' disease Mother    Hernia Father    Depression Sister    Cancer Maternal Grandmother    Anxiety disorder Maternal Grandmother    Mental illness  Maternal Grandmother    Cancer - Other Maternal Grandmother    Depression Maternal Grandfather    Parkinson's disease Maternal Grandfather    Heart attack Paternal Grandmother    Hyperlipidemia Paternal Grandfather    Breast cancer Paternal Great-grandmother     Social History   Socioeconomic History   Marital status: Single    Spouse name: Not on file   Number of children: 0   Years of education: Not on file   Highest education level: Bachelor's degree (e.g., BA, AB, BS)  Occupational History   Not on file  Tobacco Use   Smoking status: Never   Smokeless tobacco: Never  Vaping Use   Vaping status: Never Used  Substance and Sexual Activity   Alcohol use: Yes    Comment: socially. 2-3 times a month. 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 Works for [employer]   Tobacco Use: Low Risk (11/29/2024)   Patient History    Smoking Tobacco Use: Never    Smokeless Tobacco Use: Never    Passive Exposure: Not on file  Financial Resource Strain: Low Risk (11/29/2024)   Overall Financial Resource Strain (CARDIA)    Difficulty of Paying Living Expenses: Not hard at all  Food Insecurity: No Food Insecurity (11/29/2024)   Epic    Worried About Programme Researcher, Broadcasting/film/video in the Last Year: Never true    Ran Out of Food in the Last Year: Never true  Transportation Needs: No Transportation Needs (11/29/2024)   Epic    Lack of Transportation (Medical): No    Lack of Transportation (Non-Medical): No  Physical Activity: Sufficiently Active (11/29/2024)   Exercise Vital Sign    Days of Exercise per Week: 4 days    Minutes of Exercise per Session: 40 min  Stress: No Stress Concern Present (11/29/2024)    [employer] of Occupational Health -  Occupational Stress Questionnaire    Feeling of Stress: Only a little  Social Connections: Socially Isolated (11/29/2024)   Social Connection and Isolation Panel    Frequency of Communication with Friends and Family: More than three times a week    Frequency of Social Gatherings with Friends and Family: More than three times a week    Attends Religious Services: Never    Database Administrator or Organizations: No    Attends Banker Meetings: Never    Marital Status: Never married  Intimate Partner Violence: Not At Risk (11/29/2024)   Epic    Fear of Current or Ex-Partner: No    Emotionally Abused: No    Physically Abused: No    Sexually Abused: No  Depression (PHQ2-9): Low Risk (11/29/2024)   Depression (PHQ2-9)    PHQ-2 Score: 0  Alcohol Screen: Low Risk (11/29/2024)   Alcohol Screen    Last Alcohol Screening Score (AUDIT): 0  Housing: Unknown (11/29/2024)   Epic    Unable to Pay for Housing in the Last Year: No    Number of Times Moved in the Last Year: Not on file    Homeless in the Last Year: No  Utilities: Not At Risk (11/29/2024)   Epic    Threatened with loss of utilities: No  Health Literacy: Adequate Health Literacy (11/29/2024)   B1300 Health Literacy    Frequency of need for help with medical instructions: Never    Review of Systems  Constitutional:  Negative for chills, fever and malaise/fatigue.  Respiratory:  Negative for cough, shortness of breath and wheezing.   Cardiovascular:  Negative for chest pain, palpitations and leg swelling.  Gastrointestinal: Negative.   Musculoskeletal:  Positive for joint pain.  Neurological: Negative.   Psychiatric/Behavioral: Negative.         Objective    BP 101/69   Pulse 91   Temp 98.8 F (37.1 C) (Oral)   Ht 5' 1.7 (1.567 m)   Wt 136 lb 3.2 oz (61.8 kg)   LMP 11/21/2024 (Approximate)   SpO2 99%   BMI 25.15 kg/m   Physical Exam Vitals and nursing note  reviewed.  Constitutional:      General: She is awake. She is not in acute distress.    Appearance: She is well-developed and well-groomed. She is not ill-appearing or toxic-appearing.  HENT:     Head: Normocephalic.     Right Ear: Hearing and external ear normal.     Left Ear: Hearing and external ear normal.  Eyes:     General: Lids are normal.        Right eye: No discharge.        Left eye: No discharge.     Conjunctiva/sclera: Conjunctivae normal.     Pupils: Pupils are equal, round, and reactive to light.  Neck:     Thyroid : No thyromegaly.     Vascular: No carotid bruit.  Cardiovascular:     Rate and Rhythm: Normal rate and regular rhythm.     Heart sounds: Normal heart sounds. No murmur heard.    No gallop.  Pulmonary:     Effort: Pulmonary effort is normal. No accessory muscle usage or respiratory distress.     Breath sounds: Normal breath sounds. No decreased breath sounds, wheezing or rales.  Abdominal:     General: Bowel sounds are normal. There is no distension.     Palpations: Abdomen is soft.     Tenderness: There is no abdominal tenderness.  Musculoskeletal:     Cervical back: Normal range of motion and neck supple.     Right lower leg: No edema.     Left lower leg: No edema.     Right ankle: Normal.     Left ankle: No swelling. No tenderness. Normal range of motion. Normal pulse.     Comments: No bruising or swelling to lateral left ankle. No point tenderness.  Lymphadenopathy:     Cervical: No cervical adenopathy.  Skin:    General: Skin is warm and dry.  Neurological:     Mental Status: She is alert and oriented to person, place, and time.     Deep Tendon Reflexes: Reflexes are normal and symmetric.     Reflex Scores:      Brachioradialis reflexes are 2+ on the right side and 2+ on the left side.      Patellar reflexes are 2+ on the right side and 2+ on the left side. Psychiatric:        Attention and Perception: Attention normal.        Mood and  Affect: Mood normal.        Speech: Speech normal.        Behavior: Behavior normal. Behavior is cooperative.        Thought Content: Thought content normal.    Assessment & Plan:   Problem List Items Addressed This Visit       Other   Left ankle pain - Primary   Ongoing since sprain at the beginning of December. Imaging at time with no fracture. She is active at baseline, suspect this is continuing to cause some irritation to ankle with healing. Recommend when working or dancing to wear ankle compression sleeve to offer support and stability. Try not to wear heels often, trainers would offer more support. Apply ice to ankle for 15 minutes after heavy activity, like dancing, and elevate/rest. Recommend use of Voltaren gel to ankle as needed. If pain ongoing at next visit may need further imaging to assess.      Other Visit Diagnoses       Encounter to establish care       New patient to practice, introduced to practice setting and provider.       Return in about 4 weeks (around 12/27/2024) for Annual Physical -- and ankle check.   Cai Anfinson T Drayven Marchena, NP   "

## 2024-11-29 NOTE — Assessment & Plan Note (Signed)
 Ongoing since sprain at the beginning of December. Imaging at time with no fracture. She is active at baseline, suspect this is continuing to cause some irritation to ankle with healing. Recommend when working or dancing to wear ankle compression sleeve to offer support and stability. Try not to wear heels often, trainers would offer more support. Apply ice to ankle for 15 minutes after heavy activity, like dancing, and elevate/rest. Recommend use of Voltaren gel to ankle as needed. If pain ongoing at next visit may need further imaging to assess.

## 2024-12-27 ENCOUNTER — Ambulatory Visit: Admitting: Nurse Practitioner
